# Patient Record
Sex: Female | Born: 1945 | Race: White | Hispanic: No | Marital: Married | Smoking: Never smoker
Health system: Southern US, Community
[De-identification: ages and names within clinical notes are randomized; demographics above are authoritative.]

## PROBLEM LIST (undated history)

## (undated) DIAGNOSIS — R197 Diarrhea, unspecified: Secondary | ICD-10-CM

## (undated) DIAGNOSIS — I10 Essential (primary) hypertension: Secondary | ICD-10-CM

## (undated) DIAGNOSIS — K219 Gastro-esophageal reflux disease without esophagitis: Secondary | ICD-10-CM

## (undated) DIAGNOSIS — G4730 Sleep apnea, unspecified: Secondary | ICD-10-CM

## (undated) DIAGNOSIS — A159 Respiratory tuberculosis unspecified: Secondary | ICD-10-CM

## (undated) DIAGNOSIS — M1990 Unspecified osteoarthritis, unspecified site: Secondary | ICD-10-CM

## (undated) DIAGNOSIS — M7989 Other specified soft tissue disorders: Secondary | ICD-10-CM

## (undated) DIAGNOSIS — R0981 Nasal congestion: Secondary | ICD-10-CM

## (undated) HISTORY — DX: Unspecified osteoarthritis, unspecified site: M19.90

## (undated) HISTORY — DX: Respiratory tuberculosis unspecified: A15.9

## (undated) HISTORY — DX: Diarrhea, unspecified: R19.7

## (undated) HISTORY — DX: Sleep apnea, unspecified: G47.30

## (undated) HISTORY — PX: GASTRIC FUNDOPLICATION: SHX226

## (undated) HISTORY — DX: Essential (primary) hypertension: I10

## (undated) HISTORY — DX: Gastro-esophageal reflux disease without esophagitis: K21.9

## (undated) HISTORY — DX: Morbid (severe) obesity due to excess calories: E66.01

## (undated) HISTORY — DX: Other specified soft tissue disorders: M79.89

## (undated) HISTORY — DX: Nasal congestion: R09.81

---

## 1996-02-28 HISTORY — PX: ROTATOR CUFF REPAIR: SHX139

## 1997-02-27 HISTORY — PX: CARPAL TUNNEL RELEASE: SHX101

## 1997-07-09 ENCOUNTER — Ambulatory Visit (HOSPITAL_BASED_OUTPATIENT_CLINIC_OR_DEPARTMENT_OTHER): Admission: RE | Admit: 1997-07-09 | Discharge: 1997-07-09 | Payer: Self-pay | Admitting: Orthopedic Surgery

## 1997-12-03 ENCOUNTER — Ambulatory Visit (HOSPITAL_BASED_OUTPATIENT_CLINIC_OR_DEPARTMENT_OTHER): Admission: RE | Admit: 1997-12-03 | Discharge: 1997-12-03 | Payer: Self-pay | Admitting: Orthopedic Surgery

## 1998-03-12 ENCOUNTER — Ambulatory Visit (HOSPITAL_COMMUNITY): Admission: RE | Admit: 1998-03-12 | Discharge: 1998-03-12 | Payer: Self-pay | Admitting: Gastroenterology

## 1998-04-28 ENCOUNTER — Encounter: Admission: RE | Admit: 1998-04-28 | Discharge: 1998-04-28 | Payer: Self-pay | Admitting: *Deleted

## 1998-04-28 ENCOUNTER — Encounter: Payer: Self-pay | Admitting: Family Medicine

## 1998-06-29 ENCOUNTER — Ambulatory Visit (HOSPITAL_BASED_OUTPATIENT_CLINIC_OR_DEPARTMENT_OTHER): Admission: RE | Admit: 1998-06-29 | Discharge: 1998-06-29 | Payer: Self-pay | Admitting: Orthopedic Surgery

## 1999-10-17 ENCOUNTER — Ambulatory Visit (HOSPITAL_COMMUNITY): Admission: RE | Admit: 1999-10-17 | Discharge: 1999-10-17 | Payer: Self-pay | Admitting: Gastroenterology

## 2000-02-28 HISTORY — PX: REPLACEMENT TOTAL KNEE BILATERAL: SUR1225

## 2000-06-07 ENCOUNTER — Encounter: Admission: RE | Admit: 2000-06-07 | Discharge: 2000-06-07 | Payer: Self-pay | Admitting: Surgery

## 2000-06-07 ENCOUNTER — Encounter: Payer: Self-pay | Admitting: Surgery

## 2000-06-18 ENCOUNTER — Encounter: Payer: Self-pay | Admitting: Surgery

## 2000-06-18 ENCOUNTER — Ambulatory Visit (HOSPITAL_COMMUNITY): Admission: RE | Admit: 2000-06-18 | Discharge: 2000-06-18 | Payer: Self-pay | Admitting: Gastroenterology

## 2000-06-18 ENCOUNTER — Encounter: Admission: RE | Admit: 2000-06-18 | Discharge: 2000-06-18 | Payer: Self-pay | Admitting: Surgery

## 2000-07-30 ENCOUNTER — Inpatient Hospital Stay (HOSPITAL_COMMUNITY): Admission: RE | Admit: 2000-07-30 | Discharge: 2000-08-01 | Payer: Self-pay | Admitting: Surgery

## 2000-07-30 ENCOUNTER — Encounter (INDEPENDENT_AMBULATORY_CARE_PROVIDER_SITE_OTHER): Payer: Self-pay | Admitting: Specialist

## 2000-07-30 ENCOUNTER — Encounter: Payer: Self-pay | Admitting: Surgery

## 2001-02-05 ENCOUNTER — Inpatient Hospital Stay (HOSPITAL_COMMUNITY): Admission: RE | Admit: 2001-02-05 | Discharge: 2001-02-10 | Payer: Self-pay | Admitting: Orthopedic Surgery

## 2003-09-11 ENCOUNTER — Ambulatory Visit (HOSPITAL_COMMUNITY): Admission: RE | Admit: 2003-09-11 | Discharge: 2003-09-11 | Payer: Self-pay | Admitting: Gastroenterology

## 2003-11-03 ENCOUNTER — Encounter: Admission: RE | Admit: 2003-11-03 | Discharge: 2004-02-01 | Payer: Self-pay | Admitting: Surgery

## 2003-11-06 ENCOUNTER — Ambulatory Visit (HOSPITAL_BASED_OUTPATIENT_CLINIC_OR_DEPARTMENT_OTHER): Admission: RE | Admit: 2003-11-06 | Discharge: 2003-11-06 | Payer: Self-pay | Admitting: Surgery

## 2003-11-06 ENCOUNTER — Ambulatory Visit (HOSPITAL_COMMUNITY): Admission: RE | Admit: 2003-11-06 | Discharge: 2003-11-06 | Payer: Self-pay | Admitting: Surgery

## 2003-12-29 HISTORY — PX: ROUX-EN-Y GASTRIC BYPASS: SHX1104

## 2004-01-04 ENCOUNTER — Inpatient Hospital Stay (HOSPITAL_COMMUNITY): Admission: RE | Admit: 2004-01-04 | Discharge: 2004-01-07 | Payer: Self-pay | Admitting: Surgery

## 2004-03-01 ENCOUNTER — Encounter: Admission: RE | Admit: 2004-03-01 | Discharge: 2004-05-30 | Payer: Self-pay | Admitting: Surgery

## 2004-07-12 ENCOUNTER — Encounter: Admission: RE | Admit: 2004-07-12 | Discharge: 2004-10-10 | Payer: Self-pay | Admitting: Surgery

## 2004-07-26 ENCOUNTER — Observation Stay (HOSPITAL_COMMUNITY): Admission: EM | Admit: 2004-07-26 | Discharge: 2004-07-27 | Payer: Self-pay | Admitting: Emergency Medicine

## 2004-07-26 ENCOUNTER — Encounter (INDEPENDENT_AMBULATORY_CARE_PROVIDER_SITE_OTHER): Payer: Self-pay | Admitting: Specialist

## 2004-07-28 HISTORY — PX: APPENDECTOMY: SHX54

## 2004-12-13 ENCOUNTER — Encounter: Admission: RE | Admit: 2004-12-13 | Discharge: 2005-02-26 | Payer: Self-pay | Admitting: Surgery

## 2005-02-27 HISTORY — PX: TOTAL KNEE REVISION: SHX996

## 2005-06-27 HISTORY — PX: JOINT REPLACEMENT: SHX530

## 2005-07-10 ENCOUNTER — Inpatient Hospital Stay (HOSPITAL_COMMUNITY): Admission: RE | Admit: 2005-07-10 | Discharge: 2005-07-13 | Payer: Self-pay | Admitting: Orthopedic Surgery

## 2005-07-10 ENCOUNTER — Encounter (INDEPENDENT_AMBULATORY_CARE_PROVIDER_SITE_OTHER): Payer: Self-pay | Admitting: Specialist

## 2005-08-23 ENCOUNTER — Encounter: Payer: Self-pay | Admitting: Vascular Surgery

## 2005-08-23 ENCOUNTER — Ambulatory Visit (HOSPITAL_COMMUNITY): Admission: RE | Admit: 2005-08-23 | Discharge: 2005-08-23 | Payer: Self-pay | Admitting: Orthopedic Surgery

## 2008-06-25 ENCOUNTER — Encounter: Admission: RE | Admit: 2008-06-25 | Discharge: 2008-06-25 | Payer: Self-pay | Admitting: Family Medicine

## 2008-08-07 ENCOUNTER — Encounter: Admission: RE | Admit: 2008-08-07 | Discharge: 2008-08-07 | Payer: Self-pay | Admitting: Otolaryngology

## 2010-03-19 ENCOUNTER — Encounter: Payer: Self-pay | Admitting: Surgery

## 2010-06-01 ENCOUNTER — Other Ambulatory Visit: Payer: Self-pay | Admitting: Family Medicine

## 2010-06-01 DIAGNOSIS — R109 Unspecified abdominal pain: Secondary | ICD-10-CM

## 2010-06-03 ENCOUNTER — Ambulatory Visit
Admission: RE | Admit: 2010-06-03 | Discharge: 2010-06-03 | Disposition: A | Discharge status: Home / Self Care | Payer: Private Health Insurance - Indemnity | Source: Ambulatory Visit | Attending: Family Medicine | Admitting: Family Medicine

## 2010-06-03 DIAGNOSIS — R109 Unspecified abdominal pain: Secondary | ICD-10-CM

## 2010-07-15 NOTE — Procedures (Signed)
[place]. [facility]  Patient:    Yolanda Collins, Yolanda Collins Visit Number: [id number] MRN: [id number]          Service Type: SUR Location: 5000 [id number] Attending Physician:  Drema Pry Dictated by:   Cliffton Asters Ivin Booty, M.D. Proc. Date: 02/05/01 Admit Date:  02/05/2001   CC:         Anesthesia Department                           Procedure Report  PROCEDURE:  Insertion of continuous femoral nerve block catheter for postoperative analgesia.  DESCRIPTION OF PROCEDURE:  The patient was brought to the operating room today by Jearld Adjutant, M.D., for a total knee replacement on the right.  She had spoken with Dr. Randa Evens preoperatively about placement of an epidural catheter for postoperative analgesia; however, Dr. Randa Evens turned the patient over to me as she had to leave the hospital, and, on noting the patients large size, I felt it was more appropriate to place a continuous nerve block catheter. The overall placement would be easier as well as have a better chance of remaining in place and intact over time.  At the conclusion of the operation, the patient remained in the supine position and still under general anesthesia.  The right femoral area was prepped in a sterile fashion.  The 17-gauge insulated Tuohy needle with the stimulator electrode attached was then passed through the skin just lateral to the femoral artery.  With the twitch monitor set on 1.0 milliamps of current, the quadriceps were seen to twitch quite well and at this point the needle was manipulated such that we were able to obtain a twitch at 0.6 milliamps of current.  The stimulating catheter was then placed through the needle and on exiting the needle, there was continuous twitching in the quadriceps.  It was lowered to 0.4 milliamps of current, and this was observable while the catheter was inserted to a depth of 4.5 cm below the end of the needle.  The needle was removed.  The  catheter was tunneled approximately three inches lateral under the skin and secured using an OpSite.  The catheter was again made to stimulate the quadriceps muscle, and a test dose of 2 cc of 0.25% bupivacaine was given.  The twitch was then abolished by this.  This was confirmation of placement of the catheter.  A negative aspiration of blood was obtained through the catheter, and a bolus injection of 8 cc of 0.25% bupivacaine was given over several minutes.  This injection was repeated in an incremental fashion with 10 cc more for a total of 20 cc of 0.25% bupivacaine.  The patient tolerated this well.  The patient was awakened, suctioned, and extubated uneventfully in the operating room.  She was taken to the PACU in good condition.  She will be followed by the anesthesia department with continuous infusion through the catheter of between 5 and 8 cc of 0.25% bupivacaine per hour.  The catheter will be monitored by the anesthesia department until its removal in several days. Dictated by:   Cliffton Asters Ivin Booty, M.D. Attending Physician:  Drema Pry DD:  02/05/01 TD:  02/06/01 Job: [id number]

## 2010-07-15 NOTE — Procedures (Signed)
[name], [name]               ACCOUNT NO.:  [id number]   MEDICAL RECORD NO.:  [id number]       PATIENT TYPE:  OUT   LOCATION:  SLEEP CENTER                 FACILITY:  [facility]   PHYSICIAN:  Clinton D. Maple Hughson, M.D. DATE OF BIRTH:  11/01/1945   DATE OF ADMISSION:  11/06/2003  DATE OF DISCHARGE:  11/06/2003                              NOCTURNAL POLYSOMNOGRAM   REFERRING PHYSICIAN:  Thornton Park. Daphine Deutscher, M.D.   INDICATION FOR STUDY/HISTORY:  Hypersomnia with sleep apnea.   Epworth sleepiness score 20/24, BMI 44.6, weight 278 pounds.   MEDICATIONS:  Listed medications:  1.  Celebrex.  2.  Toprol XL.  3.  Synthroid.  4.  Fosamax.  5.  Alavert.  6.  Estren-D.   SLEEP ARCHITECTURE:  Total sleep time short, 243 minutes with sleep  efficiency of 54%.  Stage 1 was 8%, stage 2 was 71%, stages 3 and 4 were 3%  and REM was 17% of total sleep time.  Latency to sleep onset 67 minutes.  Latency to REM was 71 minutes.  Awake after sleep onset 139 minutes.  Arousal index 11.9.   RESPIRATORY DATA:  Split-study protocol.  RDI 22 per hour indicating  moderate obstructive apnea/hypopnea syndrome before CPAP.  This included one  obstructive apnea and 48 hypopneas before CPAP.  Events were not positional.  REM RDI was 26 per hour.  CPAP was titrated to 14 CWP.  RDI was 0 at every  pressure tested above 8 CWP to control snoring.  Suggest home trial at 14  CWP.  A petite comfort-gel Profile mask was used with heated humidifier.   OXYGEN DATA:  Very loud snoring with mild oxygen desaturation to a nadir of  81% before CPAP.  After CPAP saturation held 94%.   CARDIAC DATA:  Normal sinus rhythm with occasional PVC.   MOVEMENT/PARASOMNIA:  Occasional incidental leg jerk.   IMPRESSION/RECOMMENDATION:  Moderate obstructive sleep apnea/hypopnea  syndrome, RDI 22 per hour with mild oxygen desaturation to 81%, loud  snoring, and normal cardiac rhythm.  Successful CPAP titration to a  suggested initial  trial, pressure of 14 CWP.  RDI 0 using a petite comfort-  gel Profile mask and heated humidifier.  She is likely to need some  encouragement to use CPAP based on technician comments.                                   ______________________________                                Rennis Chris. Maple Dimarzo, M.D.                                Diplomate, American Board of Sleep Medicine    CDY/MEDQ  D:  11/11/2003 08:15:36  T:  11/11/2003 09:51:24  Job:  [id number]

## 2010-07-15 NOTE — Discharge Summary (Signed)
[place]. [facility]  Patient:    Yolanda Collins, Yolanda Collins Visit Number: [id number] MRN: [id number]          Service Type: SUR Location: 5000 5036 01 Attending Physician:  Drema Pry Dictated by:   Anise Salvo. Chestine Spore, P.A. Admit Date:  02/05/2001 Discharge Date: 02/10/2001                             Discharge Summary  REASON FOR ADMISSION:  Right knee osteoarthritis.  She was having pain with every step and pain at night.  She was unable to walk one city block.  She was having locking, catching and giving away of her right knee.  She was admitted for a right knee total arthroplasty.  She had significant osteoarthritis in all compartments.  PROCEDURE:  Right total knee arthroplasty.  Right knee tourniquet was placed. The knee was sterilely prepped from tourniquet to the toes.  A size 3 tibia standard right femur, standard +10 mm insert and 38 mm patella were inserted using cement with 750 mg of Zinacef per inch.  Postoperatively, the knee showed a range of motion from 0 to 100 degrees.  It was closed with Hemovac drains x 2 which were hooked up to Autovac.  The patient was given a continuous infusion femoral nerve block and bulky compressive dressing with an immobilizer was placed.  Tourniquet time was 1 hour 41 minutes.  The patient was then transferred to the recovery room and to the floor from there with morphine PCA and the continuous femoral nerve infusion with Foley catheter to gravity.  HOSPITAL COURSE:  On December 11, postop day #1, Lovenox and Coumadin were started per protocol. The patient complained of pain and spasms in the back of her right knee.  She was given Skelaxin for this.  Her labs showed H&H 10.9 and 33.4.  She had an INR of 1.2 and was given 5 mg of Coumadin. Rehabilitation, PT, OT and case management consultations were obtained.  On February 07, 2001, postop day #2, the patient was without complaints.  The dressing and drains  were removed.  This was well-tolerated.  Her pain was still being controlled with a nerve block PCA morphine.  Percocet p.o. was started attempting to wean her from PCA use.  On this day, her INR was 1.3. The Lovenox was continued.  She was given 5 mg of Coumadin again.  Repeat H&H was 10 and 30.6.  On December 13, postop day #3, she reported some nausea in the morning, but other than that was well. Her pain was decreased.  PCA and the femoral nerve block were discontinued.  The Foley was removed approximately four hours after the nerve block was discontinued without difficulty.  Her p.o. Percocet was continued.  She was continued with CPM and was encouraged to increase ambulation.  H&H was 10.1 and 30.9.  Her INR was 1.2.  She was continued with Lovenox and given 10 mg of Coumadin.  On December 14, postop day #4, she was without complaints.  She was ambulating well.  INR was 1.2.  She was once again given Lovenox and 10 mg of Coumadin.  On postop day #5, her INR was 1.5.  The Lovenox was discontinued.  She was given 10 mg of Coumadin and discharged home.  DISCHARGE MEDICATIONS: 1. Resume home medications. 2. Take 10 mg of Coumadin on December 16, and then 7.5 mg every day until told  otherwise by [place].  Genevieve Norlander was set up to follow her on December 18. 3. Tylox one to two tablets p.o. q.4-6h. p.r.n. pain.  ACTIVITY:  Weightbearing as tolerated.  Knee immobilizer on the right knee at all times when out of bed.  WOUND CARE:  Keep wound clean and dry.  DIET:  Follow a regular diet.  FOLLOWUP:  Follow up with Genevieve Norlander on December 18.  She is to call for an appointment with Dr. Renae Fickle, but it will be set up for February 18, 2001.  She was having [place] come in for home health aide, home health PT and OT.  CONDITION ON DISCHARGE:  Stable, responding well to her new total knee and therapeutic on her Coumadin. Dictated by:   Anise Salvo. Chestine Spore, P.A. Attending Physician:  Drema Pry DD:  02/11/01 TD:  02/11/01 Job: [phone]

## 2010-07-15 NOTE — Discharge Summary (Signed)
Yolanda Collins, Yolanda Collins               ACCOUNT NO.:  [id number]   MEDICAL RECORD NO.:  [id number]          PATIENT TYPE:  INP   LOCATION:  5031                         FACILITY:  [facility]   PHYSICIAN:  Yolanda Collins, M.D. DATE OF BIRTH:  [birth year]   DATE OF ADMISSION:  07/10/2005  DATE OF DISCHARGE:  07/13/2005                                 DISCHARGE SUMMARY   ADMISSION DIAGNOSIS:  Failed right total knee replacement.   DISCHARGE DIAGNOSES:  1.  Failed right total knee replacement.  2.  Hypertension.  3.  Hypothyroidism.  4.  Hypokalemia.   PROCEDURES PERFORMED:  Revision, right total knee replacement.   HISTORY OF PRESENT ILLNESS:  The patient is a 65 year old female who is  status post right total knee arthroplasty by Dr. Renae Collins approximately five  years ago.  She states that she had severe intermittent pain since her  surgery.  This achy pain has worsened.  Rest is helpful, but activity  worsens her symptoms.  She has taken Celebrex with only minimal help.  She  has failed conservative treatment and is indicated for revision total knee  replacement.   HOSPITAL COURSE:  This 65 year old female was admitted on 07/10/2005.  After  appropriate laboratory studies were obtained, she was taken to the operating  room where she underwent a revision of her right total knee arthroplasty.  She tolerated the procedure well.  She was placed on Ancef 1 gm IV every 8  hours for six doses postoperatively; started on Lovenox 30 mg subcu every 12  hours.  A Foley was placed intraoperatively.  Consultations with PT, OT and  case management were obtained.  A CPM was placed at 0-90% for 6-8 hours per  day.  A Dilaudid PCA pump was used for postoperative pain.  She was allowed  out of bed to a chair the following day.  Her PCA was discontinued on the  16th.  She was placed on Trinsicon one tablet p.o. 3 times per day.  Aggressive pulmonary toilette was also ordered.   She was started on OxyContin 10  mg one p.o. every 12 hours on the 16th.  Her  dressings were changed on that date.  She had developed hypokalemia on the  17th; and was placed on K-Dur 40 mg p.o.  The remainder of her hospital  course was uneventful; and she was discharged on the 17th, to return back to  the office for follow-up.  EKG read a sinus bradycardia.   LABORATORY INVESTIGATIONS:  Laboratory studies revealed a hemoglobin of  14.3, hematocrit 40.5%, white count 5500, platelets 200,000.  Discharge  hemoglobin was 10.4, hematocrit 38.3%, white count 5900, platelets 154,000.  Admission pro time was 13.7, INR 1, PTT was 32.  Preop chemistries:  sodium  141, potassium 4.2, chloride 107, bicarbonate 30, glucose 86, BUN 20,  creatinine 0.7, calcium 9.2, total protein 6.5, albumin 4.3, AST 26, ALT 22,  ALP 64, total bilirubin 0.6.  Discharge sodium was 139, potassium 3.7,  chloride 105, bicarbonate 28, glucose 96, BUN 6, creatinine of 0.7, and  calcium was 8.1.  Her  potassium on the 17th, the early morning draw, was  2.8.  Urinalysis was benign for a voided urine.  Blood type was O+, antibody  screen negative.   DISCHARGE INSTRUCTIONS:  She is to follow the blue total knee instructions  sheet.  No restriction in her diet.  No driving or lifting for six weeks.  She is to increase her activities slowly and use her walker for  weightbearing as tolerated on the right.  She is to resume her preoperative  medicines.  Lovenox 40 mg injection subcutaneously every  morning until the 28th of May, which is her last dose.  Percocet 5/325 1-2  tablets every 4 hours as needed for pain.  Follow up with Dr. Sherlean Collins two  weeks postoperatively and Yolanda Collins will take care of her for her physical  therapy.  She was discharged in improved condition.      Yolanda Collins, P.A.-C.    ______________________________  Yolanda Collins, M.D.    BDP/MEDQ  D:  08/12/2005  T:  08/12/2005  Job:  [id number]

## 2010-07-15 NOTE — Procedures (Signed)
[place]. [facility]  Patient:    Yolanda Collins, Yolanda Collins                      MRN: [id number] Proc. Date: 10/17/99 Adm. Date:  [id number] Attending:  Rich Brave CC:         Dario Guardian, M.D.   Procedure Report  PROCEDURE PERFORMED:  Upper endoscopy with biopsies.  ENDOSCOPIST:  Florencia Reasons, M.D.  INDICATIONS FOR PROCEDURE:  The patient is a 65 year old with longstanding, somewhat refractory reflux symptoms.  FINDINGS:  Wide open lower esophageal sphincter.  No evidence of reflux esophagitis or overt Barretts.  DESCRIPTION OF PROCEDURE:  The nature, purpose and risks of the procedure had been discussed with the patient, who provided written consent.  Sedation was fentanyl 75 mcg and Versed 7 mg IV without arrhythmias or desaturation. n The Olympus video endoscope was passed under direct vision.  The vocal cords looked normal.  The larynx was not optimally seen, but appeared to be free of any overt inflammatory changes.  The esophagus was easily entered.  The proximal esophageal mucosa was normal.  At the squamocolumnar junction I did not see any evidence of Barretts esophagus.  There was perhaps some minimal erythema.  The LES appeared to be wide open.  There was a 1 cm hiatal hernia present.  The stomach was entered.  It contained a small residual.  The antrum of the stomach had focal erythema as might be seen with exposure to aspirin or NSAIDs.  No erosions, ulcers or masses or polyps were observed including a retroflex view of the proximal stomach which showed a small hiatal hernia from its inferior persective and a slightly patulous diaphragmatic hiatus.  The pylorus, duodenal bulb and second duodenum looked normal.  Duodenal biopsies were obtained in view of the history of diarrhea.  The scope was then removed from the patient, who tolerated the procedure well and without apparent complication.  IMPRESSION: 1. No overt adverse  sequelae of the patients reflux noted. 2. Wide open lower esophageal sphincter, small hiatal hernia, and slightly    patulous diaphragmatic hiatus which might account for the patients reflux.  PLAN:  Await pathology on biopsies. DD:  10/17/99 TD:  10/17/99 Job: [id number]

## 2010-07-15 NOTE — Op Note (Signed)
[place]. [facility]  Patient:    RYE, DORADO Visit Number: [id number] MRN: [id number]          Service Type: SUR Location: 5000 5036 01 Attending Physician:  Drema Pry Dictated by:   Jearld Adjutant, M.D. Proc. Date: 02/05/01 Admit Date:  02/05/2001   CC:         Dario Guardian, M.D.  Arvella Merles, M.D.   Operative Report  PREOPERATIVE DIAGNOSIS:  End-stage DJD right knee.  POSTOPERATIVE DIAGNOSIS:  End-stage DJD right knee.  PROCEDURE:  Right total knee arthroplasty using cemented DePuy components with Keel tibia and rotation platform.  SURGEON:  Jearld Adjutant, M.D.  ASSISTANT:  Lubertha Basque. Jerl Santos, M.D. and Madilyn Fireman, P.A.-C.  ANESTHESIA:  General endotracheal anesthesia.  CULTURES:  None.  DRAINS:  Two medium hemovacs to autovac.  ESTIMATED BLOOD LOSS:  150 cc.  Replacement, without.  TOURNIQUET TIME:  One hour 41 minutes.  INDICATIONS:  Janijah has been a longterm patient of mine.  I saw her for right knee pain, catching, and giving way.  On October 04, 1999, a little over a year ago, about 15 months, she came to right knee arthroscopy where she had severe peripatellar arthritis in the trochlea and posterior patella.  Abrasion chondroplasties and lateral release were carried out.  She also had degenerative meniscal tearing.  Initially postoperatively, she did reasonably well, we gave her Halgan.  She got some relief, but the pain recurred.  She had another Halgan series this summer.  Finally, the pain was every-step, night pain, woke her up frequently.  X-rays had been taken preoperatively which showed narrowing medially.  He scope pictures showed significant DJD. At this time, she desired to proceed with total knee arthroplasty.  At surgery, she had significant osteoarthritis in all compartments with some scarring from previous arthroscopy.  The cartilage damage in the trochlea and patella was grade IV.  She is  a large woman.  We did use a standard right femur, size 3 tibia keeled with cement all the way down the keel and stem, a 38 mm patella with good patellar track and a standard plus 10 mm insert with good ligament balance.  Three total batches of cement were used because the first batch hardened immediately after tibial implantation.  We did use Zinacef 750 mg per each batch.  She had full knee extension with flexion to 100 limited by her posterior adipose tissue with good ligament balance.  DESCRIPTION OF PROCEDURE:  With adequate anesthesia obtained using endotracheal technique, 1 gram Ancef given IV prophylaxis and another one at tourniquet letdown, the patient was placed in the supine position.  The right lower extremity was prepped from the toes to the tourniquet in standard fashion.  After standard prepping and draping, Esmarch exsanguination was used. The tourniquet was let up to 375 mmHg.  A medial parapatellar skin incision was then followed by a medial parapatellar retinacular incision.  We then dissected the flap off the patella.  We dissected the soft tissues off the proximal tibia and everted the patella.  Residual fat pad was excised as well as osteophytes removed.  I then removed the cruciates, as much menisci as I could get from the front.  I then amputated the tibial spine and placed an intermedullary guide down the canal with the tibia in slight valgus relative to the canal.  We then set the tibial cutting jig in place to achieve slight varus of the  prosthesis with slight valgus of the leg relative to the prosthesis and made our cut.  5 mm additional was cut because we felt it would be tight.  We then sized to a standard right femur and the C-clamp fit with the 15.  We made our anterior posterior cuts, but a fit of 10 mm lollipop inflection.  We then placed a 4 degree distal femoral cutting jig 4 degree valgus and we made the first cut.  It was a little tight, so we made  an additional 2 mm cut and it fit nicely at full extension at 10 mm block.  We then balanced in flexion and extension.  The anterior posterior notch cutting chamfer jig was then put in place.  There was no need to cut the far posterior condyles because there were none.  We then sized the tibia to a 3, placed the template in place, pinned it, and then drilled a center peg hole as well as the keel.  We then trialed off this plate with a 10 mm rotating platform and placed on the standard right femur and articulated the knee with full extension.  We then cut the patella down from a 20, to a 13 and then placed the three peg patellar template, made those holes, and then placed the 38 mm patellar trial on and it tracked well without any slippage or subluxation. All trial components were then removed as the knee was jet lavaged and cement was mixed and we checked the components for size coming on the field.  We then isolated the proximal tibial and cemented on the tibial component, impacted it, and removed excess cement.  By this time, the cement had hardened.  It was very fast today at seven minutes.  So we mixed two additional batches of cement.  We then impacted on the femoral component, removed excess cement, held the knee in extension and removed excess cement and then in slight flexion while the cement cured.  The patellar button was then implanted, clamped on, and excess cement removed.  When the cement had hardened, the knee was articulated through a range of motion.  The tourniquet was let down and bleeding points were cauterized.  Additional jet lavage was carried out.  We then placed hemovac drains at the medial and lateral gutter and brought them out the superolateral portals.  The wound was then closed in layers with #1 Vicryl on the medial retinaculum, 0 and 2-0 Vicryl on the subcu, and skin staples.  The hemovacs were hooked up to autovac.  A bulky sterile compressive  dressing was  applied with Ace and knee immobilizer.  The patient having tolerated the procedure well, was awakened and taken to the recovery room in satisfactory condition to be admitted for routine postoperative care, analgesia, and CPM after a femoral nerve block was placed. Dictated by:   Jearld Adjutant, M.D. Attending Physician:  Drema Pry DD:  02/05/01 TD:  02/05/01 Job: [id number]

## 2010-07-15 NOTE — Op Note (Signed)
Yolanda Collins, Yolanda Collins               ACCOUNT NO.:  [id number]   MEDICAL RECORD NO.:  [id number]          PATIENT TYPE:  INP   LOCATION:  0105                         FACILITY:  [facility]   PHYSICIAN:  Lorre Munroe., M.D.DATE OF BIRTH:  1945-04-11   DATE OF PROCEDURE:  07/26/2004  DATE OF DISCHARGE:                                 OPERATIVE REPORT   PREOPERATIVE DIAGNOSES:  Acute appendicitis.   POSTOPERATIVE DIAGNOSES:  Acute appendicitis.   OPERATION:  Laparoscopic appendectomy.   SURGEON:  Lebron Conners, M.D.   ANESTHESIA:  General and local.   ESTIMATED BLOOD LOSS:  Minimal.   COMPLICATIONS:  None.   DESCRIPTION OF PROCEDURE:  After the patient was monitored and anesthetized  and had routine preparation and draping of the abdomen, I infiltrated local  anesthetic just below the umbilicus and below that in the lower midline and  in the right upper quadrant. I made incisions at that point. First I entered  the abdomen through an open cut 2 cm in length in the midline fascia just  below the umbilicus and then bluntly opened the peritoneum, secured a Hasson  cannula with #0 Vicryl pursestring suture in the fascia and inflated the  abdomen with CO2. I put in a large lower midline port and small right upper  quadrant port under direct view of the laparoscope and saw that there was no  injury to viscera. There was obvious inflammation in the right lower  quadrant. There were adhesions in the upper abdomen from the previous  gastric bypass surgery, but there was no other abnormality noted. Retracted  and dissected the omentum away from the appendix and broke up adhesions and  was able to elevate the appendix and then dissected the mesoappendix. I saw  the appendiceal artery and thoroughly cauterized it but left it intact to be  stapled across. After I dissected well, I stapled across the mesoappendix  and the appendix with the endoscopic cutting stapler. I noticed that there  was  an area of gangrene and possible perforation on the mid appendix but the  base appeared to be have good integrity and I thought it was safe to use a  stapler. Inspection after the appendix was divided confirmed that there was  good integrity of the cecum and appendix. I placed the appendix in a plastic  pouch and removed it from the abdomen subsequently through the umbilical  incision. I irrigated the pelvis and right lower quadrant and removed the  irrigant along with the purulent exudate which was present. There was one  small adhesion in the upper abdomen that was suspending viscus from the  anterior abdominal wall and I cut that and cauterized it. The sponge, needle  and instrument counts were correct. After removal of the appendix from the  body, I tied the  pursestring with  Vicryl suture and then watched as I removed the right  upper quadrant port. I removed the hypogastric port after allowing the CO2  to escape. I closed all skin incisions with intracuticular 4-0 Vicryl and  Steri-Strips. The patient tolerated the operation well.  WB/MEDQ  D:  07/26/2004  T:  07/27/2004  Job:  [id number]

## 2010-07-15 NOTE — Procedures (Signed)
[place]  Patient:    Yolanda Collins, Yolanda Collins                      MRN: [id number] Proc. Date: 10/17/99 Adm. Date:  [id number] Attending:  Rich Brave CC:         Dario Guardian, M.D.   Procedure Report  PROCEDURE:  Flexible sigmoidoscopy with biopsies.  INDICATIONS:  A 65 year old with intermittent diarrhea and urgency of defecation.  FINDINGS:  Normal exam to 85 cm.  Some residual stool present.  PROCEDURE:  The nature, purpose, and risk of the procedure were familiar to the patient who provided written consent.  This procedure was performed immediately following upper endoscopy, and no additional sedation was administered.  The Olympus video upper endoscope which had been used for her upper endoscopy was also used for this procedure and was advanced to about 85 cm, basically the entire length of the scope.  Pullback was then performed.  There was a fair amount of formed stool in the proximal half of this exam, basically the transverse colon and proximal descending colon, and so this could have obscured small to medium sized lesions, but it is felt that no gross or constricting lesions would have been missed in that segment of the colon, and more distally, the quality of the prep was excellent, so it is felt that all areas were well seen, in basically the distal, descending colon, sigmoid, and rectum.  This was a normal examination.  No polyps, cancer, colitis, vascular malformations, or diverticular disease were appreciated.  In retroflexion, the rectum just showed some internal hemorrhoids and hypertrophied anal papillae.  Random biopsies were obtained along the length of the examined segment of the colon to look for evidence of microscopic or collagenous colitis.  The scope was then removed from the patient who tolerated the procedure well without apparent complication.  IMPRESSION:  Normal flexible sigmoidoscopy.  PLAN:   Await pathology. DD:  10/17/99 TD:  10/17/99 Job: [id number]

## 2010-07-15 NOTE — H&P (Signed)
Yolanda Collins, Yolanda Collins               ACCOUNT NO.:  [id number]   MEDICAL RECORD NO.:  [id number]          PATIENT TYPE:  INP   LOCATION:  0105                         FACILITY:  [facility]   PHYSICIAN:  Lorre Munroe., M.D.DATE OF BIRTH:  [birth year]   DATE OF ADMISSION:  07/26/2004  DATE OF DISCHARGE:                                HISTORY & PHYSICAL   CHIEF COMPLAINT:  Abdominal pain.   HISTORY OF PRESENT ILLNESS:  This is a 65 year old white female with a one-  day history of central abdominal pain, which is now localized to the right  lower quadrant.  White count was slightly elevated at 10.3 with 92%  neutrophils.  CT scan of the abdomen and pelvis showed findings of acute  appendicitis.  Patient has no chronic GI disease.  She is six months status  post gastric bypass and has lost 68 pounds.   PAST MEDICAL HISTORY:  She has high blood pressure, which is well controlled  by Toprol.  She has no heart disease.  She has osteoarthritis and takes  Celebrex.  Other medicines are Claritin, Fosamax, and Synthroid.   No known allergies.   Nonsmoker.   FAMILY HISTORY:  Negative for familial diseases.   REVIEW OF SYSTEMS:  A 15-point review of systems negative except for joint  pain.  Weight loss.   PHYSICAL EXAMINATION:  VITAL SIGNS:  Nurses' record discloses normal  temperature and vital signs.  GENERAL:  Patient is moderately obese.  Mental status is normal.  No acute  distress.  HEENT:  Unremarkable.  NECK:  No enlargement of the thyroid.  No thyroid nodule.  No neck mass.  LUNGS:  Clear to auscultation.  HEART:  Rate and rhythm normal.  No murmur or gallop.  BREASTS:  Normal.  ABDOMEN:  Very tender in the right lower quadrant.  Bowel sounds are  present.  No mass.  Small, well-healed laparoscopic incisions without hernia  present.  EXTREMITIES:  Pulses present.  No significant edema.  NEUROLOGIC:  Grossly normal.  SKIN:  No lesions noted.   IMPRESSION:  Acute  appendicitis.   PLAN:  Immediate laparoscopic appendectomy.  Patient agrees to have the  operation done and accepts the risks of the surgery and anesthesia.      WB/MEDQ  D:  07/26/2004  T:  07/26/2004  Job:  [id number]

## 2010-07-15 NOTE — Op Note (Signed)
[facility]  Patient:    Yolanda Collins, Yolanda Collins                    MRN: [id number] Proc. Date: 07/30/00 Attending:  Thornton Park. Daphine Deutscher, M.D. CC:         Dario Guardian, M.D.  Florencia Reasons, M.D.   Operative Report  CCS NUMBER:  [id number]  PREOPERATIVE DIAGNOSIS:   Gastroesophageal reflux disease and gallstones.  POSTOPERATIVE DIAGNOSIS:  Gastroesophageal reflux disease and gallstones.  OPERATION:  Laparoscopic Nissen fundoplication and laparoscopic cholecystectomy with intraoperative cholangiogram.  SURGEON:  Thornton Park. Daphine Deutscher, M.D.  ASSISTANT:  Sandria Bales. Ezzard Standing, M.D.  DESCRIPTION OF PROCEDURE:  Yolanda Collins is a 65 year old lady who was taken to room #1 and given general anesthesia.  The abdomen was prepped with Betadine and draped sterilely.  I made a small linear incision just above the umbilicus in the midline and incised the linea of alba through a pursestring suture and passed the Hasson cannula without difficulty.  The abdomen was insufflated.  Two 10-11 were placed in the right upper quadrant and one in the left upper quadrant, and a 5 mm was placed in the upper midline.  The articulating 5 mm retractor was used to retract the liver.  The patient had pretty high anatomy and we were kind of working up to the upper limits of the supraumbilical port.  I went ahead and used the Harmonic to take down the attachments over the right crus and the EG junction of the left crus.  She had a significant lipoma herniated posteriorly.  This was taken off the esophagus. The Harmonic was used to free up the retroesophageal space.  Short gastrics were taken down and carried up into the left crus.  I used a Penrose drain around the EG junction and closed the retroesophageal space with three sutures using the Endostitch.  Initially, we tried to pass the 56 Hearst bougie, and I went ahead and put a 40 lighted down and with that in place we snugged up the crura  and then grasped the cardia, pulled it around and found a contiguous spot.  I performed a shoeshine maneuver to demonstrate the continuity.  I did an Theatre manager.  Three sutures were placed taking purchase in the esophagus, and these were tied extracorporally and intracorporally.  Clips were placed on the knots.  Next, we took a picture of the graft which looked healthy.  The gallbladder was somewhat intrahepatic.  Using the same trocar placement sites, we dissected out Calots triangle and put a clip on the gallbladder.  I incised this, placed a Reddick catheter and did a cholangiogram which showed the defined anatomy and showed small ducts.  The cystic duct was then triply clipped and divided.  The cystic artery was triply clipped and divided to remove the gallbladder from the gallbladder bed.  It was entered and decompressed, and the bowel was removed.  The patient did have fairly good size stones and there were no stones spilled.  It was passed in a bag and brought out through the umbilicus.  They inspected the area and everything looked good.  The umbilical port was tied down under direct vision.  All of the ports were injected with Marcaine.  The abdomen was deflated, and the skin was closed with 4-0 Vicryl.  The patient seemed to tolerate the procedure well and was taken to the recovery room in satisfactory condition. DD:  07/30/00 TD:  07/30/00 Job: [id number]  [id number]

## 2010-07-15 NOTE — H&P (Signed)
[facility]  Patient:    Yolanda Collins, Yolanda Collins                    MRN: [id number] Adm. Date:  [id number] Disc. Date: [id number] Attending:  Katha Cabal                         History and Physical  ADMITTING DIAGNOSES: 1. Gallstones. 2. Gastroesophageal reflux disease.  HISTORY OF PRESENT ILLNESS:  Yolanda Collins is a 65 year old lady who has been seen in my office and evaluated, having been referred by Dr. Matthias Hughs with reflux problems and was also found to have gallstones.  Informed consent was obtained regarding coming in for laparoscopic cholecystectomy as well as laparoscopic Nissan fundoplication.  Upper GI showed a small hiatal hernia associated with TE reflux and gallstones were seen, both on the flat plate as well as on an ultrasound.  PAST MEDICAL HISTORY:  Remarkable in that she has no known allergies.  MEDICATIONS: 1. Toprol XL 50 q.d. 2. Celebrex 200 mg b.i.d. 3. Paxil 30 mg. 4. Synthroid 0.125 mg q.d. 5. Allegra b.i.d.  SOCIAL HISTORY:  She drinks occasionally and does not smoke.  PRIOR SURGERY:  Includes partial hysterectomy in 1987, carpal tunnel, both wrists, rotator cuff on both shoulders, right knee, and hysterectomy.  FAMILY HISTORY:  Unremarkable.  REVIEW OF SYSTEMS:  The rest of the review of systems is noncontributory.  PHYSICAL EXAMINATION:  GENERAL:  Slightly overweight patient who is 66.5 inches in height and weight of 270.  VITAL SIGNS:  Temperature 99.4, heart rate 72, respirations 18, blood pressure 120/96.  HEENT:  Unremarkable.  CHEST:  Clear.  HEART:  Sinus rhythm without murmurs or gallops.  ABDOMEN:  Nontender.  Well-healed old surgical incisions.  EXTREMITIES:  Full range of motion.  IMPRESSION: 1. Gastroesophageal reflux disease. 2. Gallstones.  PLAN:  Laparoscopic chole _________ and laparoscopic Nissan. DD:  08/01/00 TD:  08/01/00 Job: [id number]

## 2010-07-15 NOTE — Op Note (Signed)
[name], Collins               ACCOUNT NO.:  [id number]   MEDICAL RECORD NO.:  [id number]          PATIENT TYPE:  INP   LOCATION:  0001                         FACILITY[facility]   PHYSICIAN:  Thornton Park. Daphine Deutscher, MD  DATE OF BIRTH:  [birth year]   DATE OF PROCEDURE:  01/04/2004  DATE OF DISCHARGE:                                 OPERATIVE REPORT   PREOPERATIVE DIAGNOSIS:  Morbid obesity with BMI of 42, previous Nissen  fundoplication from which she has done well but desired weight loss.   POSTOPERATIVE DIAGNOSIS:  Morbid obesity with BMI of 42, previous Nissen  fundoplication from which she has done well but desired weight loss.   PROCEDURE:  Laparoscopic take down of Nissen fundoplication, laparoscopic  Roux-En-Y gastric bypass (100 cm antecolic, antegastric Roux limb placed to  the right of the pancreatic limb with the candy-cane to the left), 40 cm  pancreatic limb, laparoscopic repair of ventral hernia.   SURGEON:  Thornton Park. Daphine Deutscher, M.D.   ASSISTANT:  Sharlet Salina T. Hoxworth, M.D., who also endoscoped the patient  which will be dictated separately.   OPERATIVE TIME:  5.5 hours.   ANESTHESIA:  General endotracheal anesthesia.   DRAINS:  One Blake drain in the upper abdomen.   DESCRIPTION OF PROCEDURE:  Ms. Tapp was taken to room 1 and given general  anesthesia.  The abdomen was prepped with Betadine and draped sterilely.  General anesthesia was administered.  I entered the abdomen through her left  upper quadrant through a previous transverse incision from her previous lap-  Nissen and used the Optiview to enter without difficulty.  The abdomen was  insufflated and then two more 12 mm were placed to the right of the midline,  one 10/11 to the left and inferior to the umbilicus.  Subsequently, an upper  midline for retracting the liver and another 5 mm was lateral.  The  procedure began with the Squiggly retractor underneath the liver and using  sharp and Harmonic  dissection, doing a meticulous dissection of the  esophagogastric junction to completely take down the Nissen fundoplication.  This took approximately 2 1/2 hours of careful dissection and I was then  able to free up the wrap and bring it around to the patient's left side and  to restore grossly normal anatomy to the stomach.   I went ahead and made my pouch first and went down about 5 cm on the lesser  curvature, dissected beneath the stomach, and I fired the 4.5 cm Ethicon  stapler once and then went up for two more firings of the 6 cm stapler to  create a small pouch.  The remnant looked good and I put Tisseel on the  remnant staple line.  All staples were fired using a minimum of 30 seconds  compression.  We then left that and went down and found the ligament of  Treitz.  I walked with the Glassman clamp 40 cm distally and divided it with  a single application of the endo-GIA.  I marked the Roux limb distally with  a single suture of silk and  the Penrose drain.  I then held the pancreatic  limb to the left of the midline and brought the Roux limb counting up 100  cm.  The jejunojejunostomy was then created aligning the antimesenteric  border of the distal end of the pancreatic limb to the distal end of the  Roux, put an anchoring suture there, creating a common defect, and firing an  endo-GIA along the antimesenteric borders to create a jejunojejunostomy.  The common defect was closed with 2-0 Vicryl from either end to make a nice  complete closure.  Tisseel was then applied over this after I checked it and  it appeared to be very tight.   We then pulled up on the Roux limb and with that in place, I put silk  sutures to close the mesenteric defect.  I sutured along the mesentery up to  the bowel, itself.  The Roux limb was then laid with the candy cane pointing  to the left and sewn along the back row of the staple line with a running 2-  0 Vicryl.  The stapler was fired after opening  both the stomach and the  jejunal sides and a nice non-bleeding staple line was seen.  The common  defect was closed from either end with 2-0 Vicryl.  The anterior layer was  reinforced with a single running suture of 2-0 Vicryl.  I then clamped off  the efferent limb and Dr. Johna Sheriff endoscoped the patient and this was  submersed.  There was no evidence of any leak.  A patent anastomosis was  visualized.  A Blake drain was brought in and placed in the upper abdomen  above the anastomosis and brought out through the 5 mm port on the left  side.  I had previously taken down an omental incarceration where a previous  Hasson had been placed just above the umbilicus.  After this had been taken  down, it left a small defect.  This was closed opening and cutting down on  it taking out the hernia sac, putting three sutures of 0 Vicryl on the  anterior fascia and then a single endo-close to approximate the fascial  defect from full thickness.  This obliterated the defect.  I went down and  looked again at the jejunojejunostomy prior to completion and everything  appeared to be intact.  The patient seemed to tolerate the procedure well.  The wounds were closed with staples and she was taken to the recovery room  in satisfactory condition.     Matt   MBM/MEDQ  D:  01/04/2004  T:  01/04/2004  Job:  [id number]

## 2010-07-15 NOTE — Discharge Summary (Signed)
Yolanda Collins, MOOD               ACCOUNT NO.:  [id number]   MEDICAL RECORD NO.:  [id number]          PATIENT TYPE:  INP   LOCATION:  0481                         FACILITY[facility]   PHYSICIAN:  Thornton Park. Daphine Deutscher, MD  DATE OF BIRTH:  [birth year]   DATE OF ADMISSION:  01/04/2004  DATE OF DISCHARGE:  01/07/2004                                 DISCHARGE SUMMARY   ADMISSION DIAGNOSIS:  Morbid obesity with body mass index of 42.  Previous  Nissen fundoplication.   PROCEDURE:  Laparoscopic take-down of Nissen fundoplication followed by  laparoscopic Roux-en-Y gastric bypass.  Patient had antegastric, antecolic,  Roux limb 100 cm placed to the right of the biliopancreatic limb with a  candy cane to the left.  Laparoscopic repair of ventral hernia.   HOSPITAL COURSE:  [name] had the above-mentioned operation on  November 7.  Postoperatively, she was kept overnight in the stepdown unit,  where she was stable.  A swallow on postop day #1 showed no leak and  emptying.  She was then started on liquids, which she tolerated very well,  and these were advanced until the time of her discharge on postop day #3,  taking liquids.  No pain or nausea.  Ready for discharge.  She was given  Roxicet elixir to take for pain and is to come back to the office next week  for drain removal.   CONDITION:  Good.     Matt   MBM/MEDQ  D:  01/07/2004  T:  01/07/2004  Job:  [id number]

## 2010-07-15 NOTE — Op Note (Signed)
Yolanda Collins, Yolanda Collins               ACCOUNT NO.:  [id number]   MEDICAL RECORD NO.:  [id number]          PATIENT TYPE:  INP   LOCATION:  5031                         FACILITY:  [facility]   PHYSICIAN:  Mila Homer. Sherlean Foot, M.D. DATE OF BIRTH:  January 26, 1946   DATE OF PROCEDURE:  07/10/2005  DATE OF DISCHARGE:                                 OPERATIVE REPORT   PREOPERATIVE DIAGNOSIS:  Right knee failed total knee arthroplasty.   POSTOPERATIVE DIAGNOSIS:  Right knee failed total knee arthroplasty.   PROCEDURE:  Right revision total knee arthroplasty (both sides).   SURGEON:  Mila Homer. Sherlean Foot, M.D.   ASSISTANT:  Legrand Pitts. Duffy, P.A.   ANESTHESIA:  General anesthesia.   INDICATIONS FOR PROCEDURE:  Patient is a 65 year old female four years  status post primary total knee replacement.  She has MRI and radiographic  evidence  of loosening of the knee replacement but work-up for infection  negative.  Informed consent was obtained.   DESCRIPTION OF PROCEDURE:  Patient was laid supine and administered general  anesthesia.  Foley catheter placement.  Right leg was prepped and draped in  the usual sterile fashion.  After sterile prep and drape, the extremity was  exsanguinated with the Esmarch and tourniquet inflated to 350 mmHg.  The #10  blade was used to make a incision through the old incision.  This was more  of a medial curvilinear incision.  I then used a fresh blade to perform a  medium parapatellar arthrotomy and perform synovectomy.  There was lots of  scar tissue in the knee. I then elevated the deep MCL off the medial crus of  the tibia around to and through the semimembranous attachment.  I then  brought the knee into flexion.  I amputated the post of the tibial  polyethylene insert and then easily removed the polyethylene insert.  I then  cleaned off the edges of both components.  I used a small sagittal saw to  create an interface between the prosthesis and cement, finished that with  revision osteotomes and easily were able to  remove both components.  It was  interesting there was very little bonding of cement to the component nor  from the cement to the bone. This appeared to be a cement issue.  I then  removed all the cement, soft tissue as well.  One I had good bone surfaces,  I then started the component sizing.  There was very minimal bone loss.  I  used the sagittal sizer and sized the femur to a size D.  I then reamed on  the femur and tibia by hand up to 60 on the femur, 12 on the tibia.  Then I  used the finishing block on the femur, size D cutting for the box.  With the  trial in place, it looked very good.  There was a very good fit.  I then  turned my attention to the tibia where I used an intramedullary cutting  block, made a perpendicular cut down to a stable platform.  There was bone  destruction and I went  below that.  I then finished the tibia with a size 3  tibial tray drilling the keel.  I then trialed all my components with a size  3 tray with a 12 stem on the tibia, size D femur with a 16 stem on the  femur, trialed multiple polyethylenes and went all the way to a 23 to obtain  excellent stability.  The patella tracked perfectly and I decided not to  replace that since radiographically it was not loose and it had tracked  very, very well.  I then copiously irrigated and put my components together.  I cemented in the components with Palacos cement with some tobramycin mixed  in, 1.2 mg per batch.  I then placed a Hemovac coming out superolaterally  and deep to the arthrotomy.  I placed the pain catheter coming out  superomedially and superficial to the arthrotomy.  I closed the arthrotomy  with figure-of-eight #1 Vicryl sutures,  deep soft tissues with interrupted  0 Vicryl suture, subcuticular 2-0 Vicryl stitch and skin staples.   COMPLICATIONS:  None.   DRAINS:  One Hemovac.   ESTIMATED BLOOD LOSS:  300 mL.   TOURNIQUET TIME:  One hour 47  minutes.           ______________________________  Mila Homer Sherlean Foot, M.D.     SDL/MEDQ  D:  07/10/2005  T:  07/10/2005  Job:  [id number]

## 2010-07-15 NOTE — Discharge Summary (Signed)
[facility]  Patient:    Yolanda Collins, Yolanda Collins                    MRN: [id number] Adm. Date:  [id number] Disc. Date: [id number] Attending:  Katha Cabal                           Discharge Summary  ADMITTING DIAGNOSES: 1. Gallstones. 2. Gastroesophageal reflux disease.  DISCHARGE DIAGNOSES: 1. Gallstones. 2. Gastroesophageal reflux disease.  PROCEDURE:  Laparoscopic nissen fundoplication and laparoscopic cholecystectomy July 30, 2000.  DISCHARGE MEDICATIONS:  Maxidone 25 for pain.  ______ .  HOSPITAL COURSE:  The patient is a 65 year old lady who was an a.m. admission and underwent a laparoscopic nissen fundoplication over a #40 lighted bougie and followed by laparoscopic cholecystectomy with intraoperative cholangiogram.  She tolerated this procedure well and was started on water the first postoperative day and was ready for discharge on the second postoperative day.  She had essentially no nausea to speak of on the second day and was ready for discharge.  She was given a script for Maxidone to take for pain and was asked to return to the office in approximately three weeks. Dietary instruction was given regarding staying on liquids to soft foods until then.  She was asked to return to her medications that she was on preoperatively including Toprol, Celebrex, Synthroid, Paxil. DD:  08/01/00 TD:  08/01/00 Job: [id number]

## 2010-07-15 NOTE — Procedures (Signed)
[place]. [facility]  Patient:    Yolanda Collins, Yolanda Collins                      MRN: [id number] Proc. Date: 06/18/00 Adm. Date:  [id number] Disc. Date: [id number] Attending:  Rich Brave CC:         Dario Guardian, M.D.  [place] Daphine Deutscher, M.D.   Procedure Report  PROCEDURE PERFORMED:  Esophageal manometry.  ENDOSCOPIST:  Florencia Reasons, M.D.  INDICATIONS FOR PROCEDURE:  The patient is a 65 year old female being considered for antireflux surgery.  FINDINGS:  Normal manometric examination.  DESCRIPTION OF PROCEDURE: The patient provided written consent for the procedure which was performed by the endoscopy nurse as an outpatient at the [facility]. [facility] endoscopy unit.  The solid state manometry catheter was passed transnasally and, after LES measurements, and esophageal body study and upper esophageal sphincter study were performed with the assistance of wet swallows.  FINDINGS: 1. Lower esophageal sphincter.  The lower esophageal sphincter had low normal    resting pressure of 11.9 mmHg (normal was greater than 10).  Relaxation    appeared normal in association with swallows and achieved 87% no baseline    pressures. 2. Esophageal body.  Amplitude and duration of contractions was within normal    limits in essentially all areas of the esophagus except for one location in    the proximal esophagus where there was just slightly below normal    esophageal amplitude. Typical amplitudes were in the 60 to 90 mmHg range.    There was no evidence of aperistaltic, repetitive, or spontaneous    activity. 3. Upper esophageal sphincter.  The upper esophageal sphincter had coordinated    relaxation and contractions in associated with pharyngeal contractions.  IMPRESSION:  Normal manometric study.  No manometric contraindication to proposed antireflux surgery.  PLAN:  Follow-up per Dr. Luretha Murphy who is seeing the patient  in consultation.DD:  06/26/00 TD:  06/26/00 Job: [id number]

## 2010-07-15 NOTE — Op Note (Signed)
NAME:  Yolanda Collins, Yolanda Collins                       ACCOUNT NO.:  [id number]   MEDICAL RECORD NO.:  [id number]                   PATIENT TYPE:  AMB   LOCATION:  ENDO                                 [facility]   PHYSICIAN:  Bernette Redbird, M.D.                DATE OF BIRTH:  [birth year]   DATE OF PROCEDURE:  09/11/2003  DATE OF DISCHARGE:                                 OPERATIVE REPORT   PROCEDURE:  Colonoscopy.   INDICATIONS:  Family history of colon cancer in her mother when she was in  her 81's.  Screening colonoscopy five years ago was negative.   FINDINGS:  Sigmoid diverticulosis.   DESCRIPTION OF PROCEDURE:  The nature, purpose, risks of the procedure were  familiar to the patient, who provided written consent.  Sedation was  fentanyl 100 mcg and Versed 10 mg IV, without arrhythmias or desaturation.   The Olympus adult video colonoscope was advanced to the cecum, as identified  by clear visualization of the appendiceal orifice.  Pullback was then  performed.  The quality of the prep was excellent, and it's felt that all  areas were well seen.   Apart from some mild to moderate sigmoid diverticulosis, this was a normal  examination.  No polyps, cancer, colitis or vascular malformations were  observed.  There was mild sigmoid diverticulosis.  Retroflexed from the  rectum and reinspection of the rectum was unremarkable.  No biopsies were  obtained.  The patient tolerated the procedure well and there were no  apparent complications.   IMPRESSION:  1. Family history of colon cancer, without worrisome findings on current     examination (V16.0).  2. Sigmoid diverticulosis.   PLAN:  Repeat colonoscopy in five years.                                               Bernette Redbird, M.D.    RB/MEDQ  D:  09/11/2003  T:  09/11/2003  Job:  [id number]   cc:   Dario Guardian, M.D.  [address]  [place]  [place] [address]  Fax: [phone]

## 2011-06-26 ENCOUNTER — Encounter: Payer: Private Health Insurance - Indemnity | Admitting: Surgery

## 2011-07-10 ENCOUNTER — Encounter: Payer: Private Health Insurance - Indemnity | Admitting: Surgery

## 2011-07-28 ENCOUNTER — Encounter: Payer: Self-pay | Admitting: Vascular Surgery

## 2011-07-31 ENCOUNTER — Encounter: Payer: Private Health Insurance - Indemnity | Admitting: Vascular Surgery

## 2012-01-05 ENCOUNTER — Ambulatory Visit (INDEPENDENT_AMBULATORY_CARE_PROVIDER_SITE_OTHER): Payer: Private Health Insurance - Indemnity | Admitting: Surgery

## 2012-01-05 ENCOUNTER — Encounter (INDEPENDENT_AMBULATORY_CARE_PROVIDER_SITE_OTHER): Payer: Self-pay | Admitting: Surgery

## 2012-01-05 DIAGNOSIS — Z9884 Bariatric surgery status: Secondary | ICD-10-CM

## 2012-01-05 DIAGNOSIS — I10 Essential (primary) hypertension: Secondary | ICD-10-CM

## 2012-01-05 NOTE — Patient Instructions (Addendum)
Thanks for your patience.  If you need further assistance after leaving the office, please call our office and speak with a [facility] nurse.  [phone].  If you want to leave a message for Dr. Schneider Warchol, please call his office phone at [phone]. 

## 2012-01-05 NOTE — Progress Notes (Signed)
Yolanda Collins 66 y.o.  Body mass index is 35.02 kg/(m^2).  There is no problem list on file for this patient.   No Known Allergies  Past Surgical History  Procedure Date  . Joint replacement 06/2005    right  . 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 Carpal tunnel release 1999    both wrist  . Rotator cuff repair 1998    both shoulders  . Appendectomy 2006   Allean Found, MD No diagnosis found.  I haven't seen [name] since 09/03/06.  She had a conversiton of a lap Nissen to a Roux Y gastric bypass in Nov 2005.  She has been running a weight of 217 and wants to get down below 200.  She has had a couple of episodes of frank reflux which may have been 2 to stasis and her pouch. I think however would be good to do a upper GI series make sure she hasn't developed a new hiatal hernia may be contributing to reflux and  in addition however like for her to see Maralyn Sago for a dietary consult. I will  get these studies and probably see her back in about 6 weeks Matt B. Daphine Deutscher, MD, [facility] [phone] beeper [phone]  01/05/2012 2:58 PM

## 2012-01-19 ENCOUNTER — Other Ambulatory Visit (INDEPENDENT_AMBULATORY_CARE_PROVIDER_SITE_OTHER): Payer: Self-pay | Admitting: Surgery

## 2012-01-19 ENCOUNTER — Ambulatory Visit
Admission: RE | Admit: 2012-01-19 | Discharge: 2012-01-19 | Disposition: A | Discharge status: Home / Self Care | Payer: Managed Care, Other (non HMO) | Source: Ambulatory Visit | Attending: Surgery | Admitting: Surgery

## 2012-01-19 DIAGNOSIS — Z9884 Bariatric surgery status: Secondary | ICD-10-CM

## 2012-01-29 ENCOUNTER — Encounter: Payer: Self-pay | Admitting: *Deleted

## 2012-01-29 ENCOUNTER — Encounter: Payer: Managed Care, Other (non HMO) | Attending: Surgery | Admitting: *Deleted

## 2012-01-29 VITALS — Ht 66.0 in | Wt 214.0 lb

## 2012-01-29 DIAGNOSIS — E669 Obesity, unspecified: Secondary | ICD-10-CM

## 2012-01-29 DIAGNOSIS — Z09 Encounter for follow-up examination after completed treatment for conditions other than malignant neoplasm: Secondary | ICD-10-CM | POA: Insufficient documentation

## 2012-01-29 DIAGNOSIS — Z713 Dietary counseling and surveillance: Secondary | ICD-10-CM | POA: Insufficient documentation

## 2012-01-29 DIAGNOSIS — Z9884 Bariatric surgery status: Secondary | ICD-10-CM | POA: Insufficient documentation

## 2012-01-29 NOTE — Patient Instructions (Addendum)
Goals:  Follow Bariatric Surgery Modified Post-Op diet to retrain pouch  Aim for intake of lean protein foods to meet 60-80g goal  Continue fluid intake of 64+ oz  Limit carbohydrate intake to 15 grams per meal/snack - no concentrated sweets or high starch foods (i.e. Chips, etc)  Aim for >30 min of physical activity daily  Take 1200-1500 mg of Calcium Citrate in 3 separate doses and 2 hours apart from iron   *Sample Supplement Schedule:  Breakfast: Multivitamin, sublingual B12 (350-500 mcg), Iron Snack: Multivitamin Lunch: Calcium (500 mg or less) Dinner: Calcium (500 mg or less) Bed:  Calcium (500 mg or less)

## 2012-01-29 NOTE — Progress Notes (Signed)
  Follow-up visit:  8 Years Post-Operative RYGB Surgery  Medical Nutrition Therapy:  Appt start time: 0915   End time:  1015.  Primary concerns today: Post-operative Bariatric Surgery Nutrition Management. Yolanda Collins is here today to re-establish care and for help reaching her weight loss goal of less than 200 lbs. Reports avg wt post-op of ~191 lbs and lowest weight of 179 lbs after knee surgery. Reports her pouch has stretched and needs revision surgery. Dietary intake looks good overall, though some meals show excessive CHO intake. Wt gain likely d/t lack of exercise, menopause/slowed metabolism, and some excessive CHO. Reports she now walks 6 flights of stairs at work on her break. Focused on 30-day exercise plan and watching CHO intake.  Will see her back in 6 weeks.   Surgery date:  12/2003 Surgery type: RYGB  Start weight: 269.0 lbs (pt reported)  Weight today: 214.0 lbs  Weight change: N/A  Total weight lost: 55.0 lbs  BMI: 34.5 kg/m^2   TANITA BODY COMP RESULTS   01/29/12  Fat Mass (lbs)  106.0  Fat Free Mass (lbs)  108.0  Total Body Water (lbs)  79.0   Medications: See medication list. Reconciled with pt at visit Supplementation: MVI pack; 1 Flintstone Complete chewable BID; calcium citrate (taking at same time as MVI), B12  24-hr recall: S (AM): Evolve protein shake 8 oz - (12g) B (AM): 1/2 cup oatmeal w/ stevia, Smart Balance butter, & cinnamon, 1 boiled egg, and 2 pcs regular bacon (10g) Snk (AM): Evolve protein shake 8 oz - (12g)  L (PM):  10" tortilla w/ 3-4 oz chicken and cheese (30g) Snk (PM): Plain Oikos greek yogurt w/ fresh blueberries and stevia (10g) D (PM): 3-4 oz baked or grilled chicken, broccoli & cauliflower mix (30g) Snk (PM): NOT DAILY - 1 cup multigrain Cheerios w/ 1/2 cup skim milk (total: 30-35g CHO, 6g pro)  Fluid intake: 1 gal water/day; 16 oz protein shakes =  ~140-145 oz Estimated total protein intake: 100-110 g  Using straws: No Drinking while  eating: No Hair loss: No Carbonated beverages: No N/V/D/C: Nausea/regurgitation/reflux - started 6 mos ago. Pouch is stretched (per pt) and reports recent regurgitation on 1 cup tortellini with marinara sauce. Does not do well with bread.   Dumping syndrome:  None reported  Recent physical activity:  Inconsistent exercise until 3 mos ago. Started walking 6 flights of stairs 1x/day at work during break  Progress Towards Goal(s):  In progress.  Handouts emailed to patient:  Holiday Eating powerpoint  Bariatric Surgery Modified Post-Op diet  RYGB Supplements handout   Nutritional Diagnosis:  [place]-3.3 Overweight/obesity related to excessive CHO intake and sedentary lifestyle s/p RYGB surgery as evidenced by failure to reach wt loss goal and patient reported dietary intake.    Intervention:  Nutrition education.   Monitoring/Evaluation:  Dietary intake, exercise, and body weight. Follow up in 4-6 weeks.

## 2012-02-09 ENCOUNTER — Ambulatory Visit (INDEPENDENT_AMBULATORY_CARE_PROVIDER_SITE_OTHER): Payer: Private Health Insurance - Indemnity | Admitting: Surgery

## 2012-03-07 ENCOUNTER — Encounter: Payer: Self-pay | Admitting: Surgery

## 2012-03-14 ENCOUNTER — Ambulatory Visit (INDEPENDENT_AMBULATORY_CARE_PROVIDER_SITE_OTHER): Payer: Private Health Insurance - Indemnity | Admitting: Surgery

## 2012-03-15 ENCOUNTER — Telehealth (INDEPENDENT_AMBULATORY_CARE_PROVIDER_SITE_OTHER): Payer: Self-pay

## 2012-03-15 NOTE — Telephone Encounter (Signed)
LMOM for pt letting her know of her new appt date and time - Fri, Jan 31 @920a 

## 2012-03-20 ENCOUNTER — Ambulatory Visit: Payer: Managed Care, Other (non HMO) | Admitting: *Deleted

## 2012-03-29 ENCOUNTER — Ambulatory Visit (INDEPENDENT_AMBULATORY_CARE_PROVIDER_SITE_OTHER): Payer: Private Health Insurance - Indemnity | Admitting: Surgery

## 2012-04-02 ENCOUNTER — Encounter: Payer: Managed Care, Other (non HMO) | Attending: Surgery | Admitting: *Deleted

## 2012-04-02 ENCOUNTER — Encounter: Payer: Self-pay | Admitting: *Deleted

## 2012-04-02 VITALS — Ht 66.0 in | Wt 214.0 lb

## 2012-04-02 DIAGNOSIS — E669 Obesity, unspecified: Secondary | ICD-10-CM

## 2012-04-02 DIAGNOSIS — Z713 Dietary counseling and surveillance: Secondary | ICD-10-CM | POA: Insufficient documentation

## 2012-04-02 DIAGNOSIS — Z09 Encounter for follow-up examination after completed treatment for conditions other than malignant neoplasm: Secondary | ICD-10-CM | POA: Insufficient documentation

## 2012-04-02 DIAGNOSIS — Z9884 Bariatric surgery status: Secondary | ICD-10-CM | POA: Insufficient documentation

## 2012-04-02 NOTE — Progress Notes (Signed)
  Follow-up visit:  8 Years Post-Operative RYGB Surgery  Medical Nutrition Therapy:  Appt start time: 0300   End time:  330.  Primary concerns today: Post-operative Bariatric Surgery Nutrition Management. Yolanda Collins is here today for f/u. Has maintained weight, but lost 8 lbs of FAT MASS and holding on to 6 lbs of TBW (fluid). Has stopped drinking tea with stevia and trying to avoid all sweeteners.  Plans to increase exercise by walking outside when weather warms.   Surgery date:  12/2003 Surgery type: RYGB  Start weight: 269.0 lbs (pt reported)  Weight today: 214.0 lbs  Weight change: 0 lbs (BUT LOST 8 LBS FAT MASS!!!)  Total weight lost: 55.0 lbs  BMI: 34.5 kg/m^2   TANITA BODY COMP RESULTS   01/29/12 04/03/11  Fat Mass (lbs)  106.0 98.0  Fat Free Mass (lbs)  108.0 116.0  Total Body Water (lbs)  79.0 85.0   Medications: See medication list. Reconciled with pt at visit Supplementation: MVI pack; 1 Flintstone Complete chewable BID; calcium citrate, B12 - Plans to scale back to only [place] MVIs  24-hr recall: B (AM): 1/2 cup oatmeal w/ stevia, Smart Balance butter, & cinnamon, 1 boiled egg, and 2 pcs regular bacon (10g) Snk (AM): Evolve protein shake 8 oz - (12g)  L (PM):  6" spinach tortilla w/ 3-4 oz chicken and cheese, salsa (30g) Snk (PM): Plain Oikos greek yogurt w/ fresh blueberries and stevia (10g) OR Vita Top muffin D (PM): 3-4 oz baked or grilled chicken, 1 cup white/brown rice, broccoli & cauliflower mix (30g) Snk (PM): Vita Top (muffin) - 4g * Eats [employer] pizza too - 250-300 cal per 1/3 of a pizza - RARELY  Fluid intake: 1 gal water/day; 8 oz protein shake =  ~120-130 oz Estimated total protein intake: 85-90 g  Using straws: No Drinking while eating: No Hair loss: No Carbonated beverages: No N/V/D/C: Nausea/regurgitation/reflux has started to resolve. Does not do well with wheat bread or beans (bloat her).   Dumping syndrome:  None reported  Recent physical  activity:  Walks 4 flights of stairs 1x/day at work during breaks (2x/day); some exercise on weekends, but inconsistent at this time  Progress Towards Goal(s):  In progress.  Nutritional Diagnosis:  -3.3 Overweight/obesity related to excessive CHO intake and sedentary lifestyle s/p RYGB surgery as evidenced by failure to reach wt loss goal and patient reported dietary intake.    Intervention:  Nutrition education.   Monitoring/Evaluation:  Dietary intake, exercise, and body weight. Follow up in 4-6 weeks.

## 2012-04-02 NOTE — Patient Instructions (Addendum)
Goals:  Follow Bariatric Surgery Modified Post-Op diet to retrain pouch  Aim for intake of lean protein foods to meet 60-80g goal  Try [facility] Protein bars (10g protein)  Limit pizza to 1 slice and add a salad  Continue fluid intake of 64+ oz  Limit carbohydrate intake to 15-20 grams per meal and 15 grams per snack - no concentrated sweets or high starch foods (i.e. Chips, etc)  Add protein serving to ALL carbs (even snacks)  Aim for >30 min of physical activity daily  Take 1200-1500 mg of Calcium Citrate in 3 separate doses and 2 hours apart from iron   *Sample Supplement Schedule:  Breakfast: Multivitamin, sublingual B12 (350-500 mcg), Iron Snack: Multivitamin Lunch: Calcium (500 mg or less) Dinner: Calcium (500 mg or less) Bed:  Calcium (500 mg or less)

## 2012-04-04 ENCOUNTER — Encounter (INDEPENDENT_AMBULATORY_CARE_PROVIDER_SITE_OTHER): Payer: Self-pay | Admitting: Surgery

## 2012-04-13 ENCOUNTER — Other Ambulatory Visit: Payer: Self-pay

## 2012-04-25 ENCOUNTER — Telehealth (INDEPENDENT_AMBULATORY_CARE_PROVIDER_SITE_OTHER): Payer: Self-pay

## 2012-04-25 NOTE — Telephone Encounter (Signed)
LMOM for pt letting her know that her scheduled appt for MM on April 11 is the first available slot he has.  I also stated that if she has any further questions that can give our office a call.

## 2012-06-07 ENCOUNTER — Ambulatory Visit (INDEPENDENT_AMBULATORY_CARE_PROVIDER_SITE_OTHER): Payer: Private Health Insurance - Indemnity | Admitting: Surgery

## 2012-06-07 ENCOUNTER — Encounter (INDEPENDENT_AMBULATORY_CARE_PROVIDER_SITE_OTHER): Payer: Self-pay | Admitting: Surgery

## 2012-06-07 VITALS — BP 128/76 | HR 70 | Temp 97.2°F | Resp 14 | Ht 66.0 in | Wt 210.4 lb

## 2012-06-07 DIAGNOSIS — Z9884 Bariatric surgery status: Secondary | ICD-10-CM

## 2012-06-07 NOTE — Progress Notes (Signed)
Yolanda Collins 67 y.o.  Body mass index is 33.98 kg/(m^2).  Patient Active Problem List  Diagnosis  . 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 Hypertension    No Known Allergies  Past Surgical History  Procedure Laterality Date  . Joint replacement  06/2005    right  . Roux-en-y gastric bypass  12/2003  . Carpal tunnel release  1999    both wrist  . Rotator cuff repair  1998    both shoulders  . Replacement total knee bilateral  2002  . Gastric fundoplication  2003 (?)  . Appendectomy  07/2004  . Total knee revision  2007    Right   Allean Found, MD No diagnosis found.  Weight is down to 210-this is short of her goal to be below 200.  She is working on it.  Will hopefully reach that goal by the summer.  I will see her again in 1 year.  Her UGI showed no GER.  Small pouch.   Return 1 year.  Matt B. Daphine Deutscher, MD, [facility] [phone] beeper [phone]  06/07/2012 5:11 PM

## 2012-07-01 ENCOUNTER — Ambulatory Visit: Payer: Managed Care, Other (non HMO) | Admitting: *Deleted

## 2012-10-02 ENCOUNTER — Other Ambulatory Visit: Payer: Self-pay

## 2013-01-02 ENCOUNTER — Other Ambulatory Visit: Payer: Self-pay

## 2014-05-14 DIAGNOSIS — I10 Essential (primary) hypertension: Secondary | ICD-10-CM | POA: Diagnosis not present

## 2014-05-14 DIAGNOSIS — Z792 Long term (current) use of antibiotics: Secondary | ICD-10-CM | POA: Diagnosis not present

## 2014-05-14 DIAGNOSIS — E039 Hypothyroidism, unspecified: Secondary | ICD-10-CM | POA: Diagnosis not present

## 2014-05-14 DIAGNOSIS — M545 Low back pain: Secondary | ICD-10-CM | POA: Diagnosis not present

## 2014-06-13 DIAGNOSIS — W010XXA Fall on same level from slipping, tripping and stumbling without subsequent striking against object, initial encounter: Secondary | ICD-10-CM | POA: Diagnosis not present

## 2014-06-13 DIAGNOSIS — M545 Low back pain: Secondary | ICD-10-CM | POA: Diagnosis not present

## 2014-06-15 ENCOUNTER — Other Ambulatory Visit: Payer: Self-pay | Admitting: Family Medicine

## 2014-06-15 DIAGNOSIS — S32010A Wedge compression fracture of first lumbar vertebra, initial encounter for closed fracture: Secondary | ICD-10-CM

## 2014-06-17 ENCOUNTER — Other Ambulatory Visit: Payer: Managed Care, Other (non HMO)

## 2014-06-19 ENCOUNTER — Ambulatory Visit
Admission: RE | Admit: 2014-06-19 | Discharge: 2014-06-19 | Disposition: A | Discharge status: Home / Self Care | Payer: Commercial Managed Care - HMO | Source: Ambulatory Visit | Attending: Family Medicine | Admitting: Family Medicine

## 2014-06-19 DIAGNOSIS — S32010A Wedge compression fracture of first lumbar vertebra, initial encounter for closed fracture: Secondary | ICD-10-CM

## 2014-06-29 ENCOUNTER — Other Ambulatory Visit: Payer: Managed Care, Other (non HMO)

## 2014-07-01 DIAGNOSIS — S32010A Wedge compression fracture of first lumbar vertebra, initial encounter for closed fracture: Secondary | ICD-10-CM | POA: Diagnosis not present

## 2014-07-22 DIAGNOSIS — Z780 Asymptomatic menopausal state: Secondary | ICD-10-CM | POA: Diagnosis not present

## 2014-08-05 DIAGNOSIS — M47816 Spondylosis without myelopathy or radiculopathy, lumbar region: Secondary | ICD-10-CM | POA: Diagnosis not present

## 2014-08-05 DIAGNOSIS — M545 Low back pain: Secondary | ICD-10-CM | POA: Diagnosis not present

## 2014-08-05 DIAGNOSIS — M4806 Spinal stenosis, lumbar region: Secondary | ICD-10-CM | POA: Diagnosis not present

## 2014-08-05 DIAGNOSIS — S32010A Wedge compression fracture of first lumbar vertebra, initial encounter for closed fracture: Secondary | ICD-10-CM | POA: Diagnosis not present

## 2014-08-17 DIAGNOSIS — M4806 Spinal stenosis, lumbar region: Secondary | ICD-10-CM | POA: Diagnosis not present

## 2014-08-17 DIAGNOSIS — S32010A Wedge compression fracture of first lumbar vertebra, initial encounter for closed fracture: Secondary | ICD-10-CM | POA: Diagnosis not present

## 2014-08-17 DIAGNOSIS — M545 Low back pain: Secondary | ICD-10-CM | POA: Diagnosis not present

## 2014-08-17 DIAGNOSIS — M47816 Spondylosis without myelopathy or radiculopathy, lumbar region: Secondary | ICD-10-CM | POA: Diagnosis not present

## 2014-08-24 ENCOUNTER — Other Ambulatory Visit: Payer: Self-pay

## 2015-03-18 ENCOUNTER — Other Ambulatory Visit: Payer: Self-pay

## 2015-03-18 DIAGNOSIS — Z1231 Encounter for screening mammogram for malignant neoplasm of breast: Secondary | ICD-10-CM

## 2015-04-05 ENCOUNTER — Ambulatory Visit: Payer: Commercial Managed Care - HMO

## 2015-04-19 ENCOUNTER — Ambulatory Visit: Payer: Commercial Managed Care - HMO

## 2015-05-04 DIAGNOSIS — E039 Hypothyroidism, unspecified: Secondary | ICD-10-CM | POA: Diagnosis not present

## 2015-05-04 DIAGNOSIS — H905 Unspecified sensorineural hearing loss: Secondary | ICD-10-CM | POA: Diagnosis not present

## 2015-05-04 DIAGNOSIS — I10 Essential (primary) hypertension: Secondary | ICD-10-CM | POA: Diagnosis not present

## 2015-12-07 DIAGNOSIS — M545 Low back pain: Secondary | ICD-10-CM | POA: Diagnosis not present

## 2015-12-07 DIAGNOSIS — Z23 Encounter for immunization: Secondary | ICD-10-CM | POA: Diagnosis not present

## 2015-12-07 DIAGNOSIS — E039 Hypothyroidism, unspecified: Secondary | ICD-10-CM | POA: Diagnosis not present

## 2015-12-07 DIAGNOSIS — I10 Essential (primary) hypertension: Secondary | ICD-10-CM | POA: Diagnosis not present

## 2016-03-13 ENCOUNTER — Other Ambulatory Visit: Payer: Self-pay | Admitting: Family Medicine

## 2016-03-13 DIAGNOSIS — Z1231 Encounter for screening mammogram for malignant neoplasm of breast: Secondary | ICD-10-CM

## 2016-04-06 ENCOUNTER — Ambulatory Visit
Admission: RE | Admit: 2016-04-06 | Discharge: 2016-04-06 | Disposition: A | Discharge status: Home / Self Care | Payer: Medicare HMO | Source: Ambulatory Visit | Attending: Family Medicine | Admitting: Family Medicine

## 2016-04-06 DIAGNOSIS — Z1231 Encounter for screening mammogram for malignant neoplasm of breast: Secondary | ICD-10-CM

## 2016-07-03 DIAGNOSIS — L258 Unspecified contact dermatitis due to other agents: Secondary | ICD-10-CM | POA: Diagnosis not present

## 2016-07-03 DIAGNOSIS — L728 Other follicular cysts of the skin and subcutaneous tissue: Secondary | ICD-10-CM | POA: Diagnosis not present

## 2016-07-19 ENCOUNTER — Ambulatory Visit (INDEPENDENT_AMBULATORY_CARE_PROVIDER_SITE_OTHER): Payer: Self-pay | Admitting: Family

## 2016-07-21 ENCOUNTER — Ambulatory Visit (INDEPENDENT_AMBULATORY_CARE_PROVIDER_SITE_OTHER): Payer: Medicare HMO | Admitting: Family

## 2016-07-21 ENCOUNTER — Ambulatory Visit (INDEPENDENT_AMBULATORY_CARE_PROVIDER_SITE_OTHER): Payer: Medicare HMO

## 2016-07-21 DIAGNOSIS — M542 Cervicalgia: Secondary | ICD-10-CM

## 2016-07-21 MED ORDER — PREDNISONE 10 MG PO TABS: ORAL_TABLET | ORAL | 0 refills | Status: AC

## 2016-07-21 NOTE — Progress Notes (Signed)
Office Visit Note   Patient: Yolanda Collins           Date of Birth: 04-11-1945           MRN: [id number] Visit Date: 07/21/2016              Requested by: Merri BrunetteSmith, Candace, MD [phone] [address] [place], [place] [id number] PCP: Merri BrunetteSmith, Candace, MD  No chief complaint on file.     HPI: The patient is a 71 year old woman seen today for evaluation of left shoulder pain. States the pain starts in the back of her shoulder, points to lateral neck and scapula. Radiates down her arm, elbow and her hand. This feels numb from time to time. Does endorse some neck pain.  Reports has had carpal tunnel in the past, feels similar to this pain. Has had rotator cuff problems, does not feel like that.   Has been ongoing for a month. Has been on vacation noticed worsened in the car as driving home.   Assessment & Plan: Visit Diagnoses:  1. Cervicalgia     Plan: Prednisone taper for cervical radiculopathy. Follow up in 4 weeks.   Follow-Up Instructions: Return in about 4 weeks (around 08/18/2016).   Back Exam   Tenderness  The patient is experiencing no tenderness.   Comments:  No pain with forward flexion or extension, spurlings on left positive   Right Hand Exam   Muscle Strength  Grip: 4/5    Left Hand Exam   Tenderness  The patient is experiencing no tenderness.     Range of Motion  The patient has normal left wrist ROM.  Muscle Strength  Grip:  4/5   Other  Pulse: present   Left Shoulder Exam   Tenderness  The patient is experiencing tenderness in the biceps tendon.  Range of Motion  The patient has normal left shoulder ROM.  Tests  Drop Arm: negative Impingement: negative  Other  Pulse: present       Patient is alert, oriented, no adenopathy, well-dressed, normal affect, normal respiratory effort.   Imaging: No results found.  Labs: No results found for: HGBA1C, ESRSEDRATE, CRP, LABURIC, REPTSTATUS, GRAMSTAIN, CULT,  LABORGA  Orders:  Orders Placed This Encounter  Procedures  . XR Cervical Spine 2 or 3 views   No orders of the defined types were placed in this encounter.    Procedures: No procedures performed  Clinical Data: No additional findings.  ROS:  All other systems negative, except as noted in the HPI. Review of Systems  Objective: Vital Signs: There were no vitals taken for this visit.  Specialty Comments:  No specialty comments available.  PMFS History: Patient Active Problem List   Diagnosis Date Noted  . Takedown lap Nissen and conversion to Roux en Y Gastric Bypass Nov 2005 01/05/2012  . Hypertension 01/05/2012   Past Medical History:  Diagnosis Date  . Arthritis   . Diarrhea   . GERD (gastroesophageal reflux disease)   . Hypertension   . Leg swelling   . Morbid obesity (HCC)   . Nasal congestion   . Sleep apnea    Pt reported on 01/29/12  . Tuberculosis     Family History  Problem Relation Age of Onset  . Cancer Mother        colon  . Cancer Father        thyroid  . Cancer Maternal Grandmother        breast  .  Breast cancer Maternal Grandmother   . Stroke Other     Past Surgical History:  Procedure Laterality Date  . APPENDECTOMY  07/2004  . CARPAL TUNNEL RELEASE  1999   both wrist  . GASTRIC FUNDOPLICATION  2003 (?)  . JOINT REPLACEMENT  06/2005   right  . REPLACEMENT TOTAL KNEE BILATERAL  2002  . ROTATOR CUFF REPAIR  1998   both shoulders  . ROUX-EN-Y GASTRIC BYPASS  12/2003  . TOTAL KNEE REVISION  2007   Right   Social History   Occupational History  . Not on file.   Social History Main Topics  . Smoking status: Never Smoker  . Smokeless tobacco: Never Used  . Alcohol use 3.6 oz/week    6 Cans of beer per week     Comment: occasionally  . Drug use: No  . Sexual activity: Not on file

## 2016-08-18 ENCOUNTER — Ambulatory Visit (INDEPENDENT_AMBULATORY_CARE_PROVIDER_SITE_OTHER): Payer: Medicare HMO | Admitting: Orthopedic Surgery

## 2016-08-28 DIAGNOSIS — K219 Gastro-esophageal reflux disease without esophagitis: Secondary | ICD-10-CM | POA: Diagnosis not present

## 2016-08-28 DIAGNOSIS — Z1389 Encounter for screening for other disorder: Secondary | ICD-10-CM | POA: Diagnosis not present

## 2016-08-28 DIAGNOSIS — M545 Low back pain: Secondary | ICD-10-CM | POA: Diagnosis not present

## 2016-08-28 DIAGNOSIS — E669 Obesity, unspecified: Secondary | ICD-10-CM | POA: Diagnosis not present

## 2016-08-28 DIAGNOSIS — M1990 Unspecified osteoarthritis, unspecified site: Secondary | ICD-10-CM | POA: Diagnosis not present

## 2016-08-28 DIAGNOSIS — Z23 Encounter for immunization: Secondary | ICD-10-CM | POA: Diagnosis not present

## 2016-08-28 DIAGNOSIS — E039 Hypothyroidism, unspecified: Secondary | ICD-10-CM | POA: Diagnosis not present

## 2016-08-28 DIAGNOSIS — I10 Essential (primary) hypertension: Secondary | ICD-10-CM | POA: Diagnosis not present

## 2016-08-28 DIAGNOSIS — Z0000 Encounter for general adult medical examination without abnormal findings: Secondary | ICD-10-CM | POA: Diagnosis not present

## 2016-08-28 DIAGNOSIS — F329 Major depressive disorder, single episode, unspecified: Secondary | ICD-10-CM | POA: Diagnosis not present

## 2016-08-31 IMAGING — MR MR LUMBAR SPINE W/O CM
4 of 5 series · 18 of 48 positions shown · non-contrast
Comparison: [REDACTED] lumbar MRI
05/20/2007.

CLINICAL DATA: 68-year-old female with lumbar back pain on the
right radiating to the right buttock for 3 weeks after slip and fall
at the pool in floor to. Initial encounter.

EXAM:
MRI LUMBAR SPINE WITHOUT CONTRAST
TECHNIQUE: Multiplanar, multisequence MR imaging of the lumbar spine was
performed. No intravenous contrast was administered.

[Series 2: T1 · sagittal · 4.0mm · 0.51mm/px · 3 of 12 slices shown (1 of 2)]
[im 1/12]
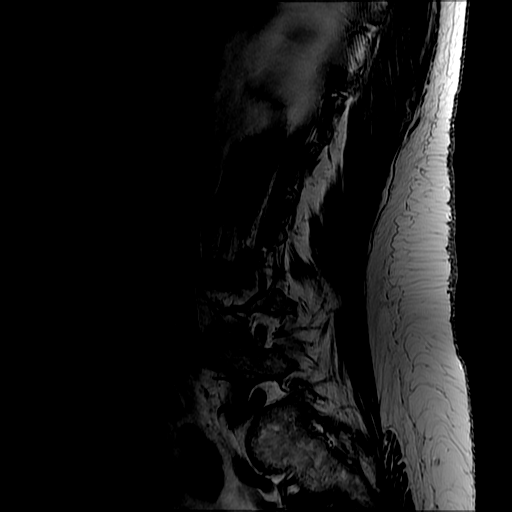
[im 8/12]
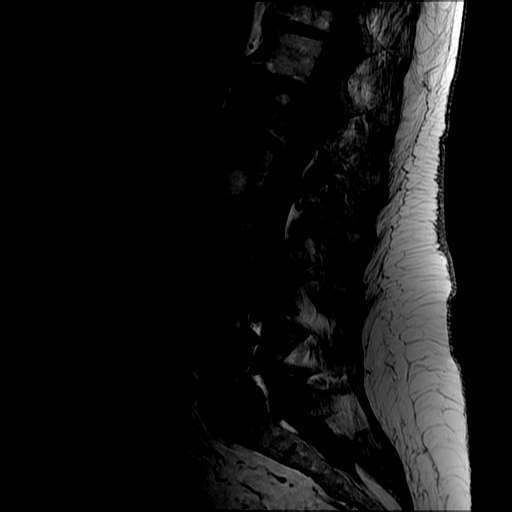
[im 12/12]
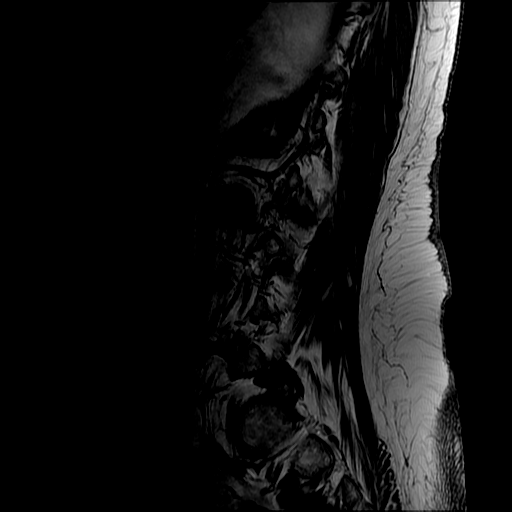

[Series 3: T2 · sagittal · 4.0mm · 0.51mm/px · 5 of 12 slices shown (1 of 2)]
[im 1/12]
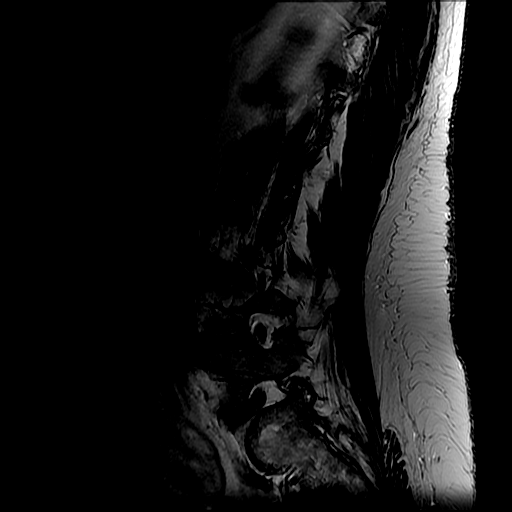
[im 3/12]
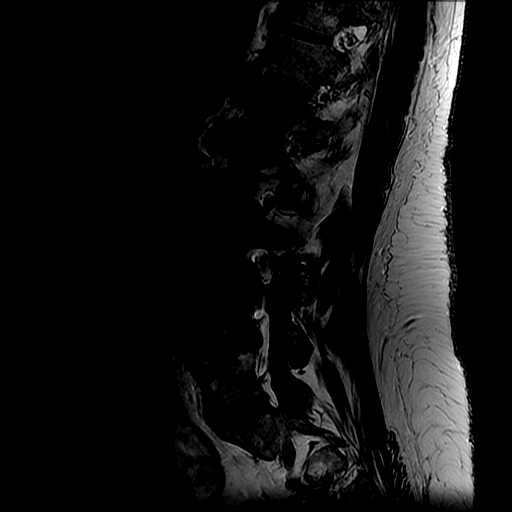
[im 6/12]
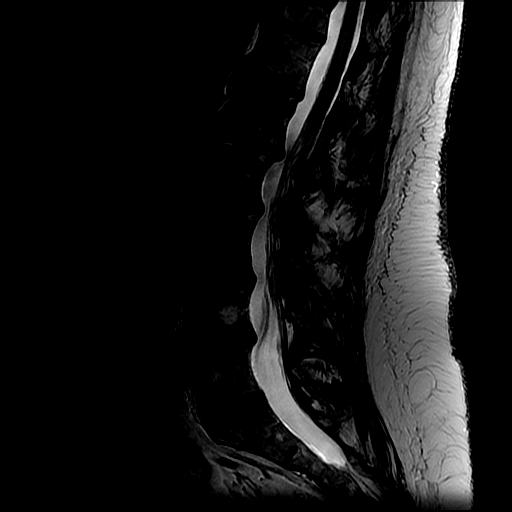
[im 9/12]
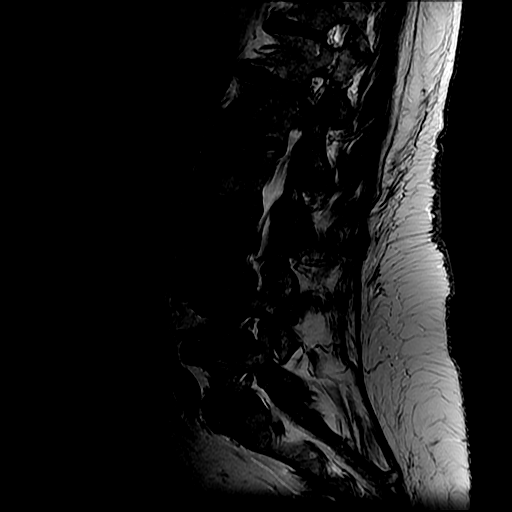
[im 12/12]
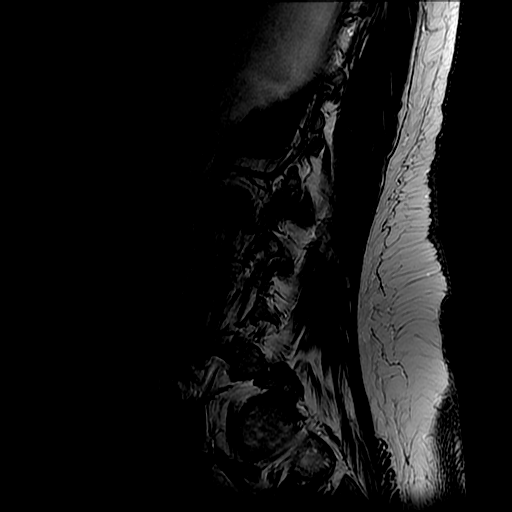

[Series 5: T2 · axial · 4.0mm · 0.39mm/px · z∈[-52,+137]mm · 7 of 40 slices shown (2 of 2)]
[im 3/40]
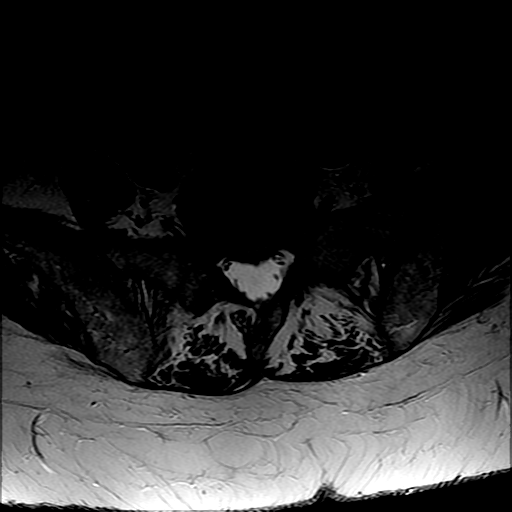
[im 5/40]
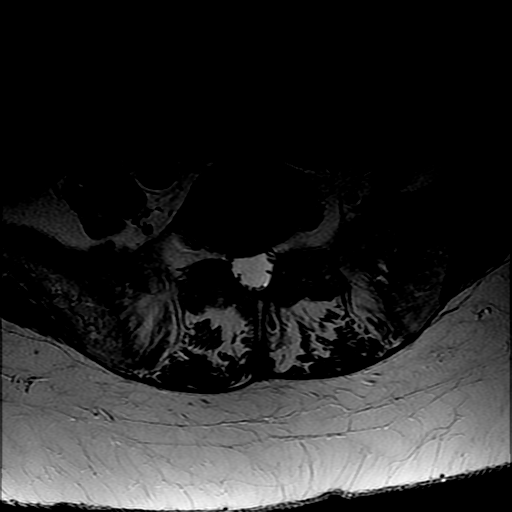
[im 8/40]
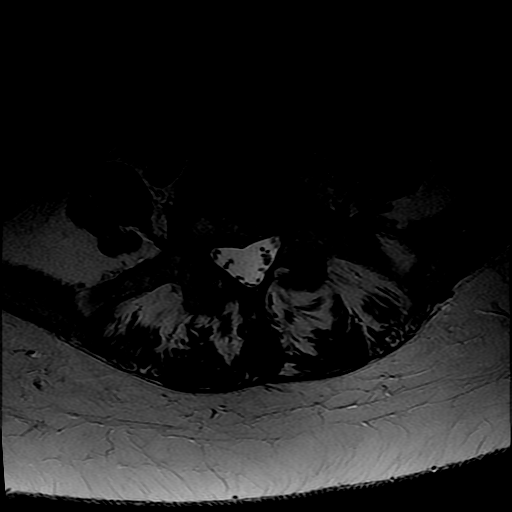
[im 13/40]
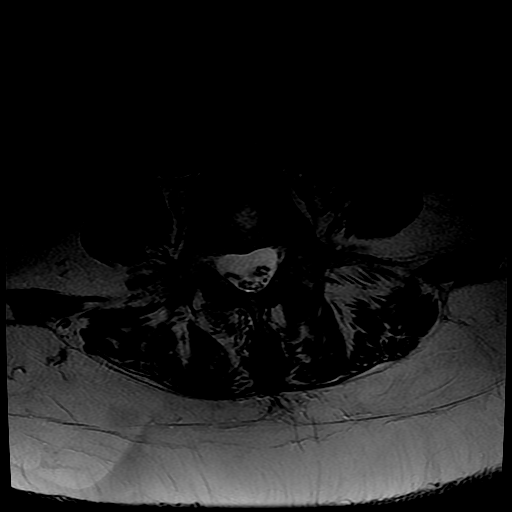
[im 18/40]
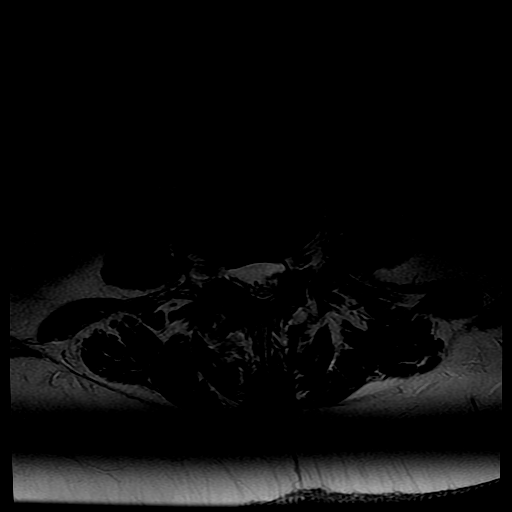
[im 20/40]
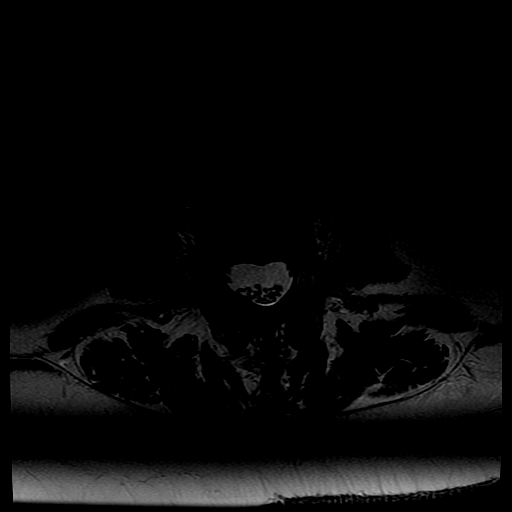
[im 35/40]
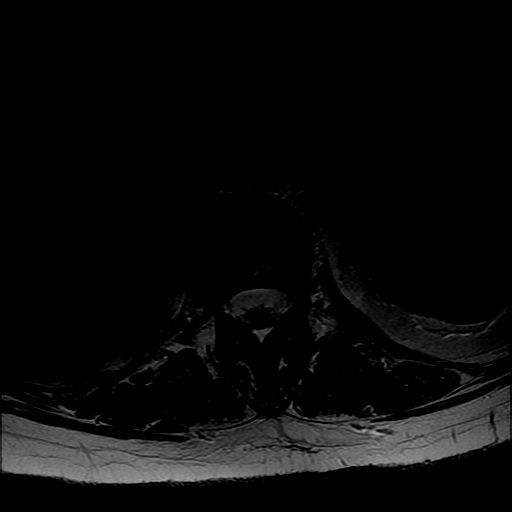

[Series 6: T1 · axial · 4.0mm · 0.39mm/px · z∈[-42,+137]mm · 3 of 40 slices shown (2 of 2)]
[im 5/40]
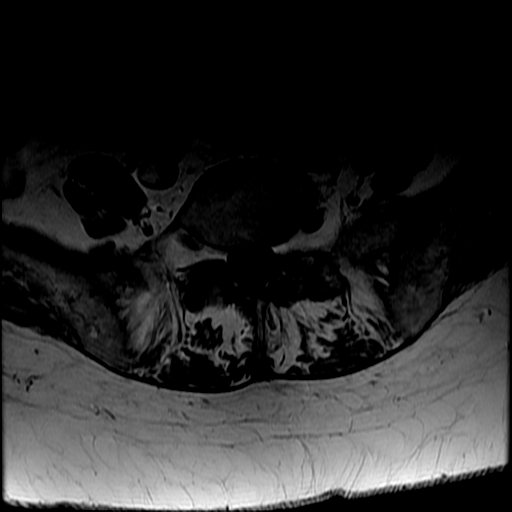
[im 20/40]
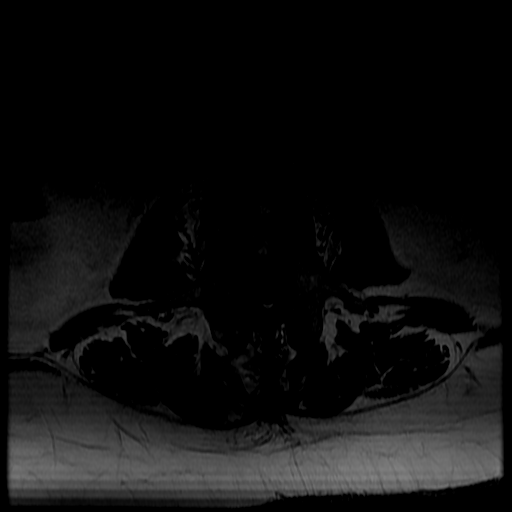
[im 35/40]
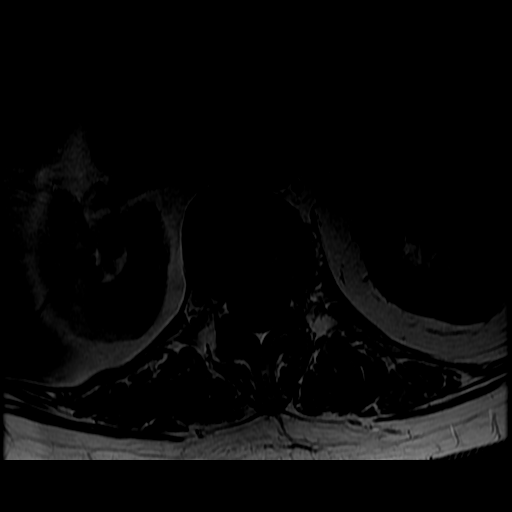

[18 of 48 positions shown; findings below may reference images not displayed]

FINDINGS: Same numbering system as in 2559 designating normal lumbar
segmentation.

New L1 compression fracture with moderate marrow edema. Deformity
primarily of the superior endplate. Loss of height up to 30%. No
pedicle or posterior element marrow edema. No significant bony
retropulsion.

Stable vertebral height and alignment elsewhere. Visible sacrum
intact.

No signal abnormality in the visualized lower thoracic spinal cord
or conus. The conus terminates at L2-L3.

Visualized abdominal viscera and paraspinal soft tissues are within
normal limits.

T11-T12:  Negative.

T12-L1: Mild vacuum disc and circumferential disc osteophyte
complex. Mild T12 foraminal stenosis.

L1-L2: Circumferential disc osteophyte complex has increased and is
eccentric to the right. Moderate facet hypertrophy has not
significantly changed. New mild spinal stenosis, no mass effect on
the conus. Mild right L1 foraminal stenosis.

L2-L3: Increased right eccentric circumferential disc bulge.
Moderate facet and ligament flavum hypertrophy is stable. No
significant spinal or foraminal stenosis. Mild right lateral recess
stenosis is increased.

L3-L4: Chronic left eccentric disc bulge is stable. Moderate to
severe facet hypertrophy is stable. Mild left lateral recess
stenosis is stable. No significant spinal stenosis. Mild left L3
foraminal stenosis is stable.

L4-L5: Chronic mild retrolisthesis with left eccentric bulky disc
osteophyte complex. Mild to moderate facet hypertrophy. Chronic mild
left lateral recess stenosis without spinal stenosis. Mild to
moderate left L4 foraminal stenosis has not significantly changed.

L5-S1: Negative disc. Chronic moderate to severe facet hypertrophy
is stable. No spinal or lateral recess stenosis. Stable mild left L5
foraminal stenosis.
IMPRESSION: 1. Acute to subacute mild L1 compression fracture. 30% loss of
height. No significant bony retropulsion, but L1-L2 disc osteophyte
complex has progressed, see #2.
2. Increased multifactorial mild spinal stenosis at L1-L2. No mass
effect on the conus medullaris.
3. Increased mild multifactorial right lateral recess stenosis at
L2-L3. Other lumbar levels appear not significantly changed since

## 2016-10-06 DIAGNOSIS — R944 Abnormal results of kidney function studies: Secondary | ICD-10-CM | POA: Diagnosis not present

## 2016-10-19 DIAGNOSIS — R21 Rash and other nonspecific skin eruption: Secondary | ICD-10-CM | POA: Diagnosis not present

## 2017-05-17 DIAGNOSIS — E039 Hypothyroidism, unspecified: Secondary | ICD-10-CM | POA: Diagnosis not present

## 2017-05-17 DIAGNOSIS — I10 Essential (primary) hypertension: Secondary | ICD-10-CM | POA: Diagnosis not present

## 2017-05-17 DIAGNOSIS — Z6833 Body mass index (BMI) 33.0-33.9, adult: Secondary | ICD-10-CM | POA: Diagnosis not present

## 2017-05-17 DIAGNOSIS — M1990 Unspecified osteoarthritis, unspecified site: Secondary | ICD-10-CM | POA: Diagnosis not present

## 2017-05-17 DIAGNOSIS — E669 Obesity, unspecified: Secondary | ICD-10-CM | POA: Diagnosis not present

## 2017-05-17 DIAGNOSIS — M545 Low back pain: Secondary | ICD-10-CM | POA: Diagnosis not present

## 2017-12-13 DIAGNOSIS — E039 Hypothyroidism, unspecified: Secondary | ICD-10-CM | POA: Diagnosis not present

## 2017-12-13 DIAGNOSIS — M129 Arthropathy, unspecified: Secondary | ICD-10-CM | POA: Diagnosis not present

## 2017-12-13 DIAGNOSIS — I10 Essential (primary) hypertension: Secondary | ICD-10-CM | POA: Diagnosis not present

## 2017-12-13 DIAGNOSIS — Z23 Encounter for immunization: Secondary | ICD-10-CM | POA: Diagnosis not present

## 2018-07-03 DIAGNOSIS — I10 Essential (primary) hypertension: Secondary | ICD-10-CM | POA: Diagnosis not present

## 2018-07-03 DIAGNOSIS — E039 Hypothyroidism, unspecified: Secondary | ICD-10-CM | POA: Diagnosis not present

## 2018-07-03 DIAGNOSIS — M549 Dorsalgia, unspecified: Secondary | ICD-10-CM | POA: Diagnosis not present

## 2018-10-04 DIAGNOSIS — I8312 Varicose veins of left lower extremity with inflammation: Secondary | ICD-10-CM | POA: Diagnosis not present

## 2018-10-04 DIAGNOSIS — I8311 Varicose veins of right lower extremity with inflammation: Secondary | ICD-10-CM | POA: Diagnosis not present

## 2018-10-29 DIAGNOSIS — I8312 Varicose veins of left lower extremity with inflammation: Secondary | ICD-10-CM | POA: Diagnosis not present

## 2018-10-29 DIAGNOSIS — I8311 Varicose veins of right lower extremity with inflammation: Secondary | ICD-10-CM | POA: Diagnosis not present

## 2018-11-06 DIAGNOSIS — I8312 Varicose veins of left lower extremity with inflammation: Secondary | ICD-10-CM | POA: Diagnosis not present

## 2018-11-06 DIAGNOSIS — I8311 Varicose veins of right lower extremity with inflammation: Secondary | ICD-10-CM | POA: Diagnosis not present

## 2018-11-06 DIAGNOSIS — R600 Localized edema: Secondary | ICD-10-CM | POA: Diagnosis not present

## 2018-12-04 DIAGNOSIS — Z1389 Encounter for screening for other disorder: Secondary | ICD-10-CM | POA: Diagnosis not present

## 2018-12-04 DIAGNOSIS — I10 Essential (primary) hypertension: Secondary | ICD-10-CM | POA: Diagnosis not present

## 2018-12-04 DIAGNOSIS — E039 Hypothyroidism, unspecified: Secondary | ICD-10-CM | POA: Diagnosis not present

## 2018-12-04 DIAGNOSIS — Z0000 Encounter for general adult medical examination without abnormal findings: Secondary | ICD-10-CM | POA: Diagnosis not present

## 2018-12-04 DIAGNOSIS — M129 Arthropathy, unspecified: Secondary | ICD-10-CM | POA: Diagnosis not present

## 2019-01-14 DIAGNOSIS — E039 Hypothyroidism, unspecified: Secondary | ICD-10-CM | POA: Diagnosis not present

## 2019-01-14 DIAGNOSIS — I10 Essential (primary) hypertension: Secondary | ICD-10-CM | POA: Diagnosis not present

## 2019-02-19 DIAGNOSIS — I8311 Varicose veins of right lower extremity with inflammation: Secondary | ICD-10-CM | POA: Diagnosis not present

## 2019-02-26 DIAGNOSIS — I8312 Varicose veins of left lower extremity with inflammation: Secondary | ICD-10-CM | POA: Diagnosis not present

## 2019-03-03 DIAGNOSIS — I8312 Varicose veins of left lower extremity with inflammation: Secondary | ICD-10-CM | POA: Diagnosis not present

## 2019-03-12 DIAGNOSIS — I83891 Varicose veins of right lower extremities with other complications: Secondary | ICD-10-CM | POA: Diagnosis not present

## 2019-03-12 DIAGNOSIS — I8311 Varicose veins of right lower extremity with inflammation: Secondary | ICD-10-CM | POA: Diagnosis not present

## 2019-03-26 DIAGNOSIS — I8312 Varicose veins of left lower extremity with inflammation: Secondary | ICD-10-CM | POA: Diagnosis not present

## 2019-04-02 DIAGNOSIS — M1990 Unspecified osteoarthritis, unspecified site: Secondary | ICD-10-CM | POA: Diagnosis not present

## 2019-04-02 DIAGNOSIS — E039 Hypothyroidism, unspecified: Secondary | ICD-10-CM | POA: Diagnosis not present

## 2019-04-02 DIAGNOSIS — I10 Essential (primary) hypertension: Secondary | ICD-10-CM | POA: Diagnosis not present

## 2019-04-02 DIAGNOSIS — F329 Major depressive disorder, single episode, unspecified: Secondary | ICD-10-CM | POA: Diagnosis not present

## 2019-04-08 DIAGNOSIS — I8311 Varicose veins of right lower extremity with inflammation: Secondary | ICD-10-CM | POA: Diagnosis not present

## 2019-04-22 DIAGNOSIS — I8312 Varicose veins of left lower extremity with inflammation: Secondary | ICD-10-CM | POA: Diagnosis not present

## 2019-04-22 DIAGNOSIS — M7981 Nontraumatic hematoma of soft tissue: Secondary | ICD-10-CM | POA: Diagnosis not present

## 2019-05-06 DIAGNOSIS — I8311 Varicose veins of right lower extremity with inflammation: Secondary | ICD-10-CM | POA: Diagnosis not present

## 2019-05-27 DIAGNOSIS — I8312 Varicose veins of left lower extremity with inflammation: Secondary | ICD-10-CM | POA: Diagnosis not present

## 2019-06-12 ENCOUNTER — Other Ambulatory Visit: Payer: Self-pay | Admitting: Family Medicine

## 2019-06-12 DIAGNOSIS — Z1231 Encounter for screening mammogram for malignant neoplasm of breast: Secondary | ICD-10-CM

## 2019-07-08 DIAGNOSIS — I8311 Varicose veins of right lower extremity with inflammation: Secondary | ICD-10-CM | POA: Diagnosis not present

## 2019-07-08 DIAGNOSIS — M7981 Nontraumatic hematoma of soft tissue: Secondary | ICD-10-CM | POA: Diagnosis not present

## 2019-07-21 DIAGNOSIS — E039 Hypothyroidism, unspecified: Secondary | ICD-10-CM | POA: Diagnosis not present

## 2019-07-21 DIAGNOSIS — M1990 Unspecified osteoarthritis, unspecified site: Secondary | ICD-10-CM | POA: Diagnosis not present

## 2019-07-21 DIAGNOSIS — I10 Essential (primary) hypertension: Secondary | ICD-10-CM | POA: Diagnosis not present

## 2019-07-21 DIAGNOSIS — F329 Major depressive disorder, single episode, unspecified: Secondary | ICD-10-CM | POA: Diagnosis not present

## 2019-10-07 DIAGNOSIS — F329 Major depressive disorder, single episode, unspecified: Secondary | ICD-10-CM | POA: Diagnosis not present

## 2019-10-07 DIAGNOSIS — I10 Essential (primary) hypertension: Secondary | ICD-10-CM | POA: Diagnosis not present

## 2019-10-07 DIAGNOSIS — M1990 Unspecified osteoarthritis, unspecified site: Secondary | ICD-10-CM | POA: Diagnosis not present

## 2019-10-07 DIAGNOSIS — E039 Hypothyroidism, unspecified: Secondary | ICD-10-CM | POA: Diagnosis not present

## 2019-11-04 DIAGNOSIS — I87323 Chronic venous hypertension (idiopathic) with inflammation of bilateral lower extremity: Secondary | ICD-10-CM | POA: Diagnosis not present

## 2019-11-18 DIAGNOSIS — M1990 Unspecified osteoarthritis, unspecified site: Secondary | ICD-10-CM | POA: Diagnosis not present

## 2019-11-18 DIAGNOSIS — M545 Low back pain: Secondary | ICD-10-CM | POA: Diagnosis not present

## 2019-11-18 DIAGNOSIS — G8929 Other chronic pain: Secondary | ICD-10-CM | POA: Diagnosis not present

## 2019-11-21 DIAGNOSIS — Z1159 Encounter for screening for other viral diseases: Secondary | ICD-10-CM | POA: Diagnosis not present

## 2019-11-26 DIAGNOSIS — Z800 Family history of malignant neoplasm of digestive organs: Secondary | ICD-10-CM | POA: Diagnosis not present

## 2019-12-04 DIAGNOSIS — E669 Obesity, unspecified: Secondary | ICD-10-CM | POA: Diagnosis not present

## 2019-12-04 DIAGNOSIS — Z23 Encounter for immunization: Secondary | ICD-10-CM | POA: Diagnosis not present

## 2019-12-04 DIAGNOSIS — E039 Hypothyroidism, unspecified: Secondary | ICD-10-CM | POA: Diagnosis not present

## 2019-12-04 DIAGNOSIS — G8929 Other chronic pain: Secondary | ICD-10-CM | POA: Diagnosis not present

## 2019-12-04 DIAGNOSIS — Z1159 Encounter for screening for other viral diseases: Secondary | ICD-10-CM | POA: Diagnosis not present

## 2019-12-04 DIAGNOSIS — Z1389 Encounter for screening for other disorder: Secondary | ICD-10-CM | POA: Diagnosis not present

## 2019-12-04 DIAGNOSIS — Z0000 Encounter for general adult medical examination without abnormal findings: Secondary | ICD-10-CM | POA: Diagnosis not present

## 2019-12-04 DIAGNOSIS — K219 Gastro-esophageal reflux disease without esophagitis: Secondary | ICD-10-CM | POA: Diagnosis not present

## 2020-06-09 ENCOUNTER — Other Ambulatory Visit: Payer: Self-pay

## 2020-06-09 ENCOUNTER — Ambulatory Visit
Admission: RE | Admit: 2020-06-09 | Discharge: 2020-06-09 | Disposition: A | Discharge status: Home / Self Care | Payer: Medicare HMO | Source: Ambulatory Visit | Attending: Family Medicine | Admitting: Family Medicine

## 2020-06-09 ENCOUNTER — Other Ambulatory Visit: Payer: Self-pay | Admitting: Family Medicine

## 2020-06-09 DIAGNOSIS — E669 Obesity, unspecified: Secondary | ICD-10-CM | POA: Diagnosis not present

## 2020-06-09 DIAGNOSIS — M545 Low back pain, unspecified: Secondary | ICD-10-CM

## 2020-06-09 DIAGNOSIS — G8929 Other chronic pain: Secondary | ICD-10-CM | POA: Diagnosis not present

## 2020-06-09 DIAGNOSIS — E039 Hypothyroidism, unspecified: Secondary | ICD-10-CM | POA: Diagnosis not present

## 2020-12-21 DIAGNOSIS — G8929 Other chronic pain: Secondary | ICD-10-CM | POA: Diagnosis not present

## 2020-12-21 DIAGNOSIS — Z23 Encounter for immunization: Secondary | ICD-10-CM | POA: Diagnosis not present

## 2020-12-21 DIAGNOSIS — Z1389 Encounter for screening for other disorder: Secondary | ICD-10-CM | POA: Diagnosis not present

## 2020-12-21 DIAGNOSIS — K219 Gastro-esophageal reflux disease without esophagitis: Secondary | ICD-10-CM | POA: Diagnosis not present

## 2020-12-21 DIAGNOSIS — Z5181 Encounter for therapeutic drug level monitoring: Secondary | ICD-10-CM | POA: Diagnosis not present

## 2020-12-21 DIAGNOSIS — Z0000 Encounter for general adult medical examination without abnormal findings: Secondary | ICD-10-CM | POA: Diagnosis not present

## 2020-12-21 DIAGNOSIS — E039 Hypothyroidism, unspecified: Secondary | ICD-10-CM | POA: Diagnosis not present

## 2020-12-21 DIAGNOSIS — I10 Essential (primary) hypertension: Secondary | ICD-10-CM | POA: Diagnosis not present

## 2021-08-22 DIAGNOSIS — M129 Arthropathy, unspecified: Secondary | ICD-10-CM | POA: Diagnosis not present

## 2021-08-22 DIAGNOSIS — Z23 Encounter for immunization: Secondary | ICD-10-CM | POA: Diagnosis not present

## 2021-08-22 DIAGNOSIS — Z79899 Other long term (current) drug therapy: Secondary | ICD-10-CM | POA: Diagnosis not present

## 2021-08-22 DIAGNOSIS — G8929 Other chronic pain: Secondary | ICD-10-CM | POA: Diagnosis not present

## 2021-08-22 DIAGNOSIS — E039 Hypothyroidism, unspecified: Secondary | ICD-10-CM | POA: Diagnosis not present

## 2021-10-25 DIAGNOSIS — H2513 Age-related nuclear cataract, bilateral: Secondary | ICD-10-CM | POA: Diagnosis not present

## 2021-10-25 DIAGNOSIS — H40013 Open angle with borderline findings, low risk, bilateral: Secondary | ICD-10-CM | POA: Diagnosis not present

## 2021-10-25 DIAGNOSIS — H25013 Cortical age-related cataract, bilateral: Secondary | ICD-10-CM | POA: Diagnosis not present

## 2021-10-25 DIAGNOSIS — H18413 Arcus senilis, bilateral: Secondary | ICD-10-CM | POA: Diagnosis not present

## 2021-10-25 DIAGNOSIS — H2511 Age-related nuclear cataract, right eye: Secondary | ICD-10-CM | POA: Diagnosis not present

## 2022-03-23 DIAGNOSIS — G8929 Other chronic pain: Secondary | ICD-10-CM | POA: Diagnosis not present

## 2022-03-23 DIAGNOSIS — Z0000 Encounter for general adult medical examination without abnormal findings: Secondary | ICD-10-CM | POA: Diagnosis not present

## 2022-03-23 DIAGNOSIS — I10 Essential (primary) hypertension: Secondary | ICD-10-CM | POA: Diagnosis not present

## 2022-03-23 DIAGNOSIS — E039 Hypothyroidism, unspecified: Secondary | ICD-10-CM | POA: Diagnosis not present

## 2022-03-23 DIAGNOSIS — Z13220 Encounter for screening for lipoid disorders: Secondary | ICD-10-CM | POA: Diagnosis not present

## 2022-03-23 DIAGNOSIS — E669 Obesity, unspecified: Secondary | ICD-10-CM | POA: Diagnosis not present

## 2022-03-23 DIAGNOSIS — M5451 Vertebrogenic low back pain: Secondary | ICD-10-CM | POA: Diagnosis not present

## 2022-03-23 DIAGNOSIS — Z1331 Encounter for screening for depression: Secondary | ICD-10-CM | POA: Diagnosis not present

## 2022-03-23 DIAGNOSIS — R7309 Other abnormal glucose: Secondary | ICD-10-CM | POA: Diagnosis not present

## 2022-03-27 DIAGNOSIS — H43393 Other vitreous opacities, bilateral: Secondary | ICD-10-CM | POA: Diagnosis not present

## 2022-03-27 DIAGNOSIS — H2513 Age-related nuclear cataract, bilateral: Secondary | ICD-10-CM | POA: Diagnosis not present

## 2022-03-27 DIAGNOSIS — H2511 Age-related nuclear cataract, right eye: Secondary | ICD-10-CM | POA: Diagnosis not present

## 2022-03-27 DIAGNOSIS — H25042 Posterior subcapsular polar age-related cataract, left eye: Secondary | ICD-10-CM | POA: Diagnosis not present

## 2022-03-28 DIAGNOSIS — H2512 Age-related nuclear cataract, left eye: Secondary | ICD-10-CM | POA: Diagnosis not present

## 2022-04-28 DIAGNOSIS — H2512 Age-related nuclear cataract, left eye: Secondary | ICD-10-CM | POA: Diagnosis not present

## 2022-10-10 DIAGNOSIS — E669 Obesity, unspecified: Secondary | ICD-10-CM | POA: Diagnosis not present

## 2022-10-10 DIAGNOSIS — G8929 Other chronic pain: Secondary | ICD-10-CM | POA: Diagnosis not present

## 2022-10-10 DIAGNOSIS — M545 Low back pain, unspecified: Secondary | ICD-10-CM | POA: Diagnosis not present

## 2022-10-10 DIAGNOSIS — E039 Hypothyroidism, unspecified: Secondary | ICD-10-CM | POA: Diagnosis not present

## 2022-11-13 DIAGNOSIS — E039 Hypothyroidism, unspecified: Secondary | ICD-10-CM | POA: Diagnosis not present

## 2023-05-30 DIAGNOSIS — Z9889 Other specified postprocedural states: Secondary | ICD-10-CM | POA: Diagnosis not present

## 2023-05-30 DIAGNOSIS — E669 Obesity, unspecified: Secondary | ICD-10-CM | POA: Diagnosis not present

## 2023-05-30 DIAGNOSIS — Z23 Encounter for immunization: Secondary | ICD-10-CM | POA: Diagnosis not present

## 2023-05-30 DIAGNOSIS — E039 Hypothyroidism, unspecified: Secondary | ICD-10-CM | POA: Diagnosis not present

## 2023-05-30 DIAGNOSIS — Z0000 Encounter for general adult medical examination without abnormal findings: Secondary | ICD-10-CM | POA: Diagnosis not present

## 2023-05-30 DIAGNOSIS — G8929 Other chronic pain: Secondary | ICD-10-CM | POA: Diagnosis not present

## 2023-05-30 DIAGNOSIS — Z1331 Encounter for screening for depression: Secondary | ICD-10-CM | POA: Diagnosis not present

## 2023-05-30 DIAGNOSIS — I10 Essential (primary) hypertension: Secondary | ICD-10-CM | POA: Diagnosis not present

## 2023-05-30 DIAGNOSIS — K219 Gastro-esophageal reflux disease without esophagitis: Secondary | ICD-10-CM | POA: Diagnosis not present

## 2023-05-31 ENCOUNTER — Other Ambulatory Visit: Payer: Self-pay | Admitting: Family Medicine

## 2023-05-31 DIAGNOSIS — Z1231 Encounter for screening mammogram for malignant neoplasm of breast: Secondary | ICD-10-CM

## 2023-06-18 ENCOUNTER — Ambulatory Visit

## 2023-06-18 ENCOUNTER — Inpatient Hospital Stay: Admission: RE | Admit: 2023-06-18 | Source: Ambulatory Visit

## 2023-09-17 ENCOUNTER — Other Ambulatory Visit: Payer: Self-pay | Admitting: Medical Genetics

## 2023-11-29 DIAGNOSIS — E039 Hypothyroidism, unspecified: Secondary | ICD-10-CM | POA: Diagnosis not present

## 2023-11-29 DIAGNOSIS — E669 Obesity, unspecified: Secondary | ICD-10-CM | POA: Diagnosis not present

## 2023-11-29 DIAGNOSIS — M545 Low back pain, unspecified: Secondary | ICD-10-CM | POA: Diagnosis not present

## 2023-12-28 ENCOUNTER — Other Ambulatory Visit: Payer: Self-pay | Admitting: Medical Genetics

## 2023-12-28 DIAGNOSIS — Z006 Encounter for examination for normal comparison and control in clinical research program: Secondary | ICD-10-CM

## 2024-02-19 ENCOUNTER — Other Ambulatory Visit (HOSPITAL_BASED_OUTPATIENT_CLINIC_OR_DEPARTMENT_OTHER)

## 2024-02-29 ENCOUNTER — Ambulatory Visit

## 2024-02-29 ENCOUNTER — Other Ambulatory Visit

## 2024-03-12 ENCOUNTER — Ambulatory Visit

## 2024-03-21 ENCOUNTER — Ambulatory Visit

## 2024-05-13 ENCOUNTER — Other Ambulatory Visit (HOSPITAL_BASED_OUTPATIENT_CLINIC_OR_DEPARTMENT_OTHER)
